# Patient Record
Sex: Female | Born: 1995 | Race: White | Hispanic: No | Marital: Single | Smoking: Current some day smoker
Health system: Southern US, Community
[De-identification: ages and names within clinical notes are randomized; demographics above are authoritative.]

## PROBLEM LIST (undated history)

## (undated) DIAGNOSIS — F603 Borderline personality disorder: Secondary | ICD-10-CM

## (undated) DIAGNOSIS — F419 Anxiety disorder, unspecified: Secondary | ICD-10-CM

## (undated) DIAGNOSIS — D649 Anemia, unspecified: Secondary | ICD-10-CM

---

## 2018-12-24 ENCOUNTER — Emergency Department
Admission: EM | Admit: 2018-12-24 | Discharge: 2018-12-25 | Disposition: A | Discharge status: Home / Self Care | Payer: Self-pay | Attending: Emergency Medicine | Admitting: Emergency Medicine

## 2018-12-24 ENCOUNTER — Encounter: Payer: Self-pay | Admitting: Emergency Medicine

## 2018-12-24 ENCOUNTER — Other Ambulatory Visit: Payer: Self-pay

## 2018-12-24 DIAGNOSIS — F419 Anxiety disorder, unspecified: Secondary | ICD-10-CM | POA: Insufficient documentation

## 2018-12-24 DIAGNOSIS — R0789 Other chest pain: Secondary | ICD-10-CM | POA: Insufficient documentation

## 2018-12-24 HISTORY — DX: Borderline personality disorder: F60.3

## 2018-12-24 HISTORY — DX: Anxiety disorder, unspecified: F41.9

## 2018-12-24 LAB — COMPREHENSIVE METABOLIC PANEL
ALT: 20 U/L (ref 0–44)
AST: 23 U/L (ref 15–41)
Albumin: 3.6 g/dL (ref 3.5–5.0)
Alkaline Phosphatase: 80 U/L (ref 38–126)
Anion gap: 8 (ref 5–15)
BUN: 10 mg/dL (ref 6–20)
CO2: 28 mmol/L (ref 22–32)
Calcium: 8.7 mg/dL — ABNORMAL LOW (ref 8.9–10.3)
Chloride: 104 mmol/L (ref 98–111)
Creatinine, Ser: 0.81 mg/dL (ref 0.44–1.00)
GFR calc Af Amer: >60 mL/min (ref >60)
GFR calc non Af Amer: >60 mL/min (ref >60)
Glucose, Bld: 109 mg/dL — ABNORMAL HIGH (ref 70–99)
Potassium: 3.7 mmol/L (ref 3.5–5.1)
Sodium: 140 mmol/L (ref 135–145)
Total Bilirubin: 0.3 mg/dL (ref 0.3–1.2)
Total Protein: 7 g/dL (ref 6.5–8.1)

## 2018-12-24 LAB — CBC
HCT: 36.3 % (ref 36.0–46.0)
Hemoglobin: 11.2 g/dL — ABNORMAL LOW (ref 12.0–15.0)
MCH: 23.7 pg — ABNORMAL LOW (ref 26.0–34.0)
MCHC: 30.9 g/dL (ref 30.0–36.0)
MCV: 76.7 fL — ABNORMAL LOW (ref 80.0–100.0)
Platelets: 312 10*3/uL (ref 150–400)
RBC: 4.73 MIL/uL (ref 3.87–5.11)
RDW: 17.4 % — ABNORMAL HIGH (ref 11.5–15.5)
WBC: 12.1 10*3/uL — ABNORMAL HIGH (ref 4.0–10.5)
nRBC: 0 % (ref 0.0–0.2)

## 2018-12-24 NOTE — Discharge Instructions (Signed)
Your workup in the Emergency Department today was reassuring.  We did not find any specific abnormalities.  We recommend you drink plenty of fluids, take your regular medications and/or any new ones prescribed today, and follow up with the doctor(s) listed in these documents as recommended.  Return to the Emergency Department if you develop new or worsening symptoms that concern you.  

## 2018-12-24 NOTE — ED Provider Notes (Signed)
[facility] Emergency Department Provider Note  ____________________________________________   First MD Initiated Contact with Patient 12/24/18 2302     (approximate)  I have reviewed the triage vital signs and the nursing notes.   HISTORY  Chief Complaint Anxiety    HPI Elizabeth Simon is a 23 y.o. female with medical history as listed below who presents for evaluation of left-sided chest discomfort.  She says that she was riding in a car with someone when she started to feel anxious with some aching pain in the left side of her chest and a little bit in the left shoulder as well as some burning pain in her upper abdomen.  Her friend told her she which is having a panic attack but she was concerned and wanted to be evaluated.  She feels much better now.  She says she still has a little bit of aching pain in the left upper part of her chest which feels better if she rubs on it and feels a little bit worse if she moves around or moves her left shoulder.  She does not remember sustaining an injury.  The onset was acute and the symptoms were moderate to severe but now they are almost completely gone.  She admits that this does feel similar to prior anxiety attacks but she has not had an anxiety attack in a long time.  She denies fever/chills, contact with COVID patients, sore throat, nausea, vomiting, lower abdominal pain, and dysuria.  She has had no recent medication changes.  She smokes marijuana daily for her anxiety and said that she smoked earlier today.  She denies any other drug use.  She does not use birth control pills, has no history of blood clots in the legs of the lungs, has not been on any long trips or had any surgeries or immobilizations.  She has no first-degree family members who have had heart attacks.  She does not have diabetes, hypertension, nor hyperlipidemia.         Past Medical History:  Diagnosis Date  . Anxiety   . Borderline personality  disorder (HCC)     There are no active problems to display for this patient.   History reviewed. No pertinent surgical history.  Prior to Admission medications   Not on File    Allergies Patient has no allergy information on record.  No family history on file.  Social History Social History   Tobacco Use  . Smoking status: Not on file  Substance Use Topics  . Alcohol use: Not on file  . Drug use: Not on file    Review of Systems Constitutional: No fever/chills Eyes: No visual changes. ENT: No sore throat. Cardiovascular: Left upper chest aching discomfort as well as upper abdominal burning Respiratory: Denies shortness of breath. Gastrointestinal: Burning upper abdominal pain.  No nausea, no vomiting.  No diarrhea.  No constipation. Genitourinary: Negative for dysuria. Musculoskeletal: Left shoulder discomfort, reproducible and feels better with rubbing.  negative for neck pain.  Negative for back pain. Integumentary: Negative for rash. Neurological: Negative for headaches, focal weakness or numbness.   ____________________________________________   PHYSICAL EXAM:  VITAL SIGNS: ED Triage Vitals  Enc Vitals Group     BP 12/24/18 1829 126/64     Pulse Rate 12/24/18 1829 90     Resp 12/24/18 1829 20     Temp 12/24/18 1829 98 F (36.7 C)     Temp Source 12/24/18 1829 Oral  SpO2 12/24/18 1829 100 %     Weight 12/24/18 1830 120.2 kg (265 lb)     Height 12/24/18 1830 1.676 m (5\' 6" )     Head Circumference --      Peak Flow --      Pain Score 12/24/18 1830 6     Pain Loc --      Pain Edu? --      Excl. in GC? --     Constitutional: Alert and oriented. Well appearing and in no acute distress. Eyes: Conjunctivae are normal.  Head: Atraumatic. Nose: No congestion/rhinnorhea. Mouth/Throat: Mucous membranes are moist. Neck: No stridor.  No meningeal signs.   Cardiovascular: Normal rate, regular rhythm. Good peripheral circulation. Grossly normal heart  sounds. Respiratory: Normal respiratory effort.  No retractions. No audible wheezing. Gastrointestinal: Soft and nontender. No distention.  Musculoskeletal: No lower extremity tenderness nor edema. No gross deformities of extremities.  Some reproducible tenderness with moving around of the left shoulder, she says it feels better when rubbing the left chest wall.  Neurologic:  Normal speech and language. No gross focal neurologic deficits are appreciated.  Skin:  Skin is warm, dry and intact. No rash noted. Psychiatric: Mood and affect are normal. Speech and behavior are normal.  ____________________________________________   LABS (all labs ordered are listed, but only abnormal results are displayed)  Labs Reviewed  CBC - Abnormal; Notable for the following components:      Result Value   WBC 12.1 (*)    Hemoglobin 11.2 (*)    MCV 76.7 (*)    MCH 23.7 (*)    RDW 17.4 (*)    All other components within normal limits  COMPREHENSIVE METABOLIC PANEL - Abnormal; Notable for the following components:   Glucose, Bld 109 (*)    Calcium 8.7 (*)    All other components within normal limits   ____________________________________________  EKG  ED ECG REPORT I, Loleta Roseory Eris Breck, the attending physician, personally viewed and interpreted this ECG.  Date: 12/24/2018 EKG Time: 18: 31 Rate: 89 Rhythm: normal sinus rhythm QRS Axis: normal Intervals: normal ST/T Wave abnormalities: Inverted T waves in lead III. Narrative Interpretation: no definitive evidence of acute ischemia; does not meet STEMI criteria.   ____________________________________________  RADIOLOGY I, Loleta Roseory Miamarie Moll, personally viewed and evaluated these images (plain radiographs) as part of my medical decision making, as well as reviewing the written report by the radiologist.  ED MD interpretation: No indication for imaging  Official radiology report(s): No results found.  ____________________________________________    PROCEDURES   Procedure(s) performed (including Critical Care):  Procedures   ____________________________________________   INITIAL IMPRESSION / MDM / ASSESSMENT AND PLAN / ED COURSE  As part of my medical decision making, I reviewed the following data within the electronic MEDICAL RECORD NUMBER Nursing notes reviewed and incorporated, Labs reviewed , EKG interpreted , Old chart reviewed and Notes from prior ED visits      *Almedia Ballsikki C Maneri was evaluated in Emergency Department on 12/25/2018 for the symptoms described in the history of present illness. She was evaluated in the context of the global COVID-19 pandemic, which necessitated consideration that the patient might be at risk for infection with the SARS-CoV-2 virus that causes COVID-19. Institutional protocols and algorithms that pertain to the evaluation of patients at risk for COVID-19 are in a state of rapid change based on information released by regulatory bodies including the CDC and federal and state organizations. These policies and algorithms were followed during the  patient's care in the ED.*  Differential diagnosis includes, but is not limited to, anxiety/panic attack, musculoskeletal strain, ACS, PE, less likely pneumonia or COVID-19.  The patient has no signs or symptoms or risk factors for COVID-19.  She also is very low risk for ACS and she is PERC negative.  Originally troponin was not drawn with her labs but her EKG is very reassuring and her signs and symptoms are not at all consistent with ACS in addition to being 23 years old and the only risk factor being tobacco use.  She is eager and anxious to go home I think it is appropriate as a troponin will have very little utility in this case and will only delay care given the incredibly low utility of a troponin and this patient population.  She was reassured by the fact that her hemoglobin was at 11.2 because she said that she has suffered from chronic anemia in the past.  She is  well, asymptomatic except for some reproducible left upper chest musculoskeletal discomfort, and appropriate for discharge and outpatient follow-up.  I gave her strict return precautions should she develop any new or worsening symptoms and she understands and agrees with the plan.      ____________________________________________  FINAL CLINICAL IMPRESSION(S) / ED DIAGNOSES  Final diagnoses:  Atypical chest pain  Anxiety     MEDICATIONS GIVEN DURING THIS VISIT:  Medications - No data to display   ED Discharge Orders    None       Note:  This document was prepared using Dragon voice recognition software and may include unintentional dictation errors.   Loleta Rose, MD 12/25/18 [phone]

## 2018-12-24 NOTE — ED Triage Notes (Signed)
Pt reports has an odd feeling in her chest. Pt states it is sort of like anxiety but she has not had anxiety in 10 months. Pt states she just feels like it is hard to breathe and she can't sit still.

## 2019-01-08 ENCOUNTER — Encounter: Payer: Self-pay | Admitting: Emergency Medicine

## 2019-01-08 ENCOUNTER — Other Ambulatory Visit: Payer: Self-pay

## 2019-01-08 ENCOUNTER — Emergency Department: Payer: Medicaid Other

## 2019-01-08 ENCOUNTER — Emergency Department
Admission: EM | Admit: 2019-01-08 | Discharge: 2019-01-08 | Disposition: A | Discharge status: Home / Self Care | Payer: Medicaid Other | Attending: Emergency Medicine | Admitting: Emergency Medicine

## 2019-01-08 DIAGNOSIS — K219 Gastro-esophageal reflux disease without esophagitis: Secondary | ICD-10-CM | POA: Insufficient documentation

## 2019-01-08 DIAGNOSIS — J0110 Acute frontal sinusitis, unspecified: Secondary | ICD-10-CM | POA: Insufficient documentation

## 2019-01-08 LAB — TROPONIN I: Troponin I: <0.03 ng/mL (ref <0.03)

## 2019-01-08 LAB — BASIC METABOLIC PANEL
Anion gap: 10 (ref 5–15)
BUN: 11 mg/dL (ref 6–20)
CO2: 26 mmol/L (ref 22–32)
Calcium: 8.9 mg/dL (ref 8.9–10.3)
Chloride: 100 mmol/L (ref 98–111)
Creatinine, Ser: 0.67 mg/dL (ref 0.44–1.00)
GFR calc Af Amer: >60 mL/min (ref >60)
GFR calc non Af Amer: >60 mL/min (ref >60)
Glucose, Bld: 107 mg/dL — ABNORMAL HIGH (ref 70–99)
Potassium: 3.7 mmol/L (ref 3.5–5.1)
Sodium: 136 mmol/L (ref 135–145)

## 2019-01-08 LAB — CBC
HCT: 36.9 % (ref 36.0–46.0)
Hemoglobin: 11.5 g/dL — ABNORMAL LOW (ref 12.0–15.0)
MCH: 23.3 pg — ABNORMAL LOW (ref 26.0–34.0)
MCHC: 31.2 g/dL (ref 30.0–36.0)
MCV: 74.8 fL — ABNORMAL LOW (ref 80.0–100.0)
Platelets: 353 10*3/uL (ref 150–400)
RBC: 4.93 MIL/uL (ref 3.87–5.11)
RDW: 17.2 % — ABNORMAL HIGH (ref 11.5–15.5)
WBC: 11.2 10*3/uL — ABNORMAL HIGH (ref 4.0–10.5)
nRBC: 0 % (ref 0.0–0.2)

## 2019-01-08 MED ORDER — FLUTICASONE PROPIONATE 50 MCG/ACT NA SUSP: 2.0000 | Freq: Every day | NASAL | 0 refills | Status: DC

## 2019-01-08 MED ORDER — AMOXICILLIN 500 MG PO TABS: 500.0000 mg | ORAL_TABLET | Freq: Three times a day (TID) | ORAL | 0 refills | Status: DC

## 2019-01-08 MED ORDER — FAMOTIDINE 20 MG PO TABS: 20.0000 mg | ORAL_TABLET | Freq: Two times a day (BID) | ORAL | 0 refills | Status: DC

## 2019-01-08 NOTE — Discharge Instructions (Signed)
Follow up with the primary care provider for symptoms that are not improving over the next few days. ° °Return to the ER for symptoms that change or worsen if unable to schedule an appointment. °

## 2019-01-08 NOTE — ED Triage Notes (Signed)
Pt reports HA for 2 days that hurts mostly in her face. Pt also reports some chest discomfort that she describes as acid reflux. Pt denies SOB, fevers, nausea or other sx;s.

## 2019-01-08 NOTE — ED Notes (Signed)
See triage note  Presents with headache for the past 2 days  States pain is mainly to right side of head and under right eye  States she fell on Thursday but did not hit her head

## 2019-01-09 NOTE — ED Provider Notes (Signed)
[facility] Emergency Department Provider Note  ___________________________________________   First MD Initiated Contact with Patient 01/08/19 1236     (approximate)  I have reviewed the triage vital signs and the nursing notes.   HISTORY  Chief Complaint Headache  HPI Elizabeth Simon is a 23 y.o. female who presents to the emergency department for multiple medical complaints.  She states that she has had a headache for 2 days and sinus pain and pressure on the right side since that time as well.  She also states that she has some significant sleep anxiety, but is afraid to take any melatonin or sleep aids.  She reports her anxiety causes her to have acid reflux and she knows that this is what causes her chest discomfort but it makes the anxiety worse.  Pain is better after antacids.  Headache has not been relieved with Tylenol and ibuprofen.     Past Medical History:  Diagnosis Date  . Anxiety   . Borderline personality disorder (HCC)     There are no active problems to display for this patient.   History reviewed. No pertinent surgical history.  Prior to Admission medications   Medication Sig Start Date End Date Taking? Authorizing Provider  amoxicillin (AMOXIL) 500 MG tablet Take 1 tablet (500 mg total) by mouth 3 (three) times daily. 01/08/19   Dillen Belmontes, Rulon Eisenmengerari B, FNP  famotidine (PEPCID) 20 MG tablet Take 1 tablet (20 mg total) by mouth 2 (two) times daily for 30 days. 01/08/19 02/07/19  Jaeleen Inzunza, Kasandra Knudsenari B, FNP  fluticasone (FLONASE) 50 MCG/ACT nasal spray Place 2 sprays into both nostrils daily for 30 days. 01/08/19 02/07/19  Chinita Pesterriplett, Seth Friedlander B, FNP    Allergies Patient has no allergy information on record.  No family history on file.  Social History Social History   Tobacco Use  . Smoking status: Not on file  Substance Use Topics  . Alcohol use: Not on file  . Drug use: Not on file    Review of Systems  Constitutional: No fever/chills  Eyes: No visual changes. ENT: No sore throat. Cardiovascular: Denies chest pain currently. Respiratory: Denies shortness of breath. Gastrointestinal: No abdominal pain.  No nausea, no vomiting.  No diarrhea.  No constipation. Genitourinary: Negative for dysuria. Musculoskeletal: Negative for back pain. Skin: Negative for rash. Neurological: Negative for headaches, focal weakness or numbness. ____________________________________________   PHYSICAL EXAM:  VITAL SIGNS: ED Triage Vitals [01/08/19 1122]  Enc Vitals Group     BP 110/76     Pulse Rate 96     Resp 20     Temp 98.3 F (36.8 C)     Temp Source Oral     SpO2 99 %     Weight 265 lb (120.2 kg)     Height 5\' 6"  (1.676 m)     Head Circumference      Peak Flow      Pain Score 5     Pain Loc      Pain Edu?      Excl. in GC?     Constitutional: Alert and oriented. Well appearing and in no acute distress. Eyes: Conjunctivae are normal. PERRL. EOMI. Head: Atraumatic. Nose: No congestion/rhinnorhea.  Tenderness over the right maxillary sinus on percussion Mouth/Throat: Mucous membranes are moist.  Oropharynx non-erythematous. Neck: No stridor.   Cardiovascular: Normal rate, regular rhythm. Grossly normal heart sounds.  Good peripheral circulation. Respiratory: Normal respiratory effort.  No retractions. Lungs CTAB. Gastrointestinal: Soft and nontender.  No distention. No abdominal bruits. No CVA tenderness. Musculoskeletal: No lower extremity tenderness nor edema.  No joint effusions. Neurologic:  Normal speech and language. No gross focal neurologic deficits are appreciated. No gait instability. Skin:  Skin is warm, dry and intact. No rash noted. Psychiatric: Mood and affect are normal. Speech and behavior are normal.  ____________________________________________   LABS (all labs ordered are listed, but only abnormal results are displayed)  Labs Reviewed  BASIC METABOLIC PANEL - Abnormal; Notable for the following  components:      Result Value   Glucose, Bld 107 (*)    All other components within normal limits  CBC - Abnormal; Notable for the following components:   WBC 11.2 (*)    Hemoglobin 11.5 (*)    MCV 74.8 (*)    MCH 23.3 (*)    RDW 17.2 (*)    All other components within normal limits  TROPONIN I   ____________________________________________  EKG  ED ECG REPORT I, Davaughn Hillyard, FNP-BC personally viewed and interpreted this ECG.   Date: 01/09/2019  EKG Time: 11:28 AM  Rate: 99  Rhythm: normal EKG, normal sinus rhythm  Axis: normal  Intervals:none  ST&T Change: none  ____________________________________________  RADIOLOGY  ED MD interpretation:  No acute abnormality.  Official radiology report(s): Dg Chest 2 View  Result Date: 01/08/2019 CLINICAL DATA:  Headache, chest discomfort EXAM: CHEST - 2 VIEW COMPARISON:  None. FINDINGS: The heart size and mediastinal contours are within normal limits. Both lungs are clear. The visualized skeletal structures are unremarkable. IMPRESSION: No acute abnormality of the lungs.  No focal airspace opacity. Electronically Signed   By: Lauralyn Primes M.D.   On: 01/08/2019 12:43    ____________________________________________   PROCEDURES  Procedure(s) performed (including Critical Care):  Procedures   ____________________________________________   INITIAL IMPRESSION / ASSESSMENT AND PLAN / ED COURSE  As part of my medical decision making, I reviewed the following data within the electronic MEDICAL RECORD NUMBER Notes from prior ED visits        23 year old female presenting to the emergency department for multiple medical complaints.  Exam is consistent with a sinusitis which would also explain headache.  She will be treated with antibiotics.  We discussed sleep aids including melatonin and Tylenol PM or Advil PM and she states that she is willing to try them.  For her reflux, she will be given a prescription for Pepcid.  She was  encouraged to establish primary care provider when possible.  She is to return to the emergency department for symptoms change or worsen if she is unable to get an appointment.      ____________________________________________   FINAL CLINICAL IMPRESSION(S) / ED DIAGNOSES  Final diagnoses:  Acute non-recurrent frontal sinusitis  Gastroesophageal reflux disease without esophagitis     ED Discharge Orders         Ordered    famotidine (PEPCID) 20 MG tablet  2 times daily     01/08/19 1255    amoxicillin (AMOXIL) 500 MG tablet  3 times daily     01/08/19 1255    fluticasone (FLONASE) 50 MCG/ACT nasal spray  Daily     01/08/19 1255           Note:  This document was prepared using Dragon voice recognition software and may include unintentional dictation errors.    Chinita Pester, FNP 01/09/19 0900    Sharman Cheek, MD 01/09/19 [phone]

## 2019-01-14 ENCOUNTER — Encounter: Payer: Self-pay | Admitting: Emergency Medicine

## 2019-01-14 ENCOUNTER — Other Ambulatory Visit: Payer: Self-pay

## 2019-01-14 DIAGNOSIS — R51 Headache: Secondary | ICD-10-CM | POA: Insufficient documentation

## 2019-01-14 NOTE — ED Triage Notes (Signed)
Patient ambulatory to triage with steady gait, without difficulty or distress noted; pt reports right sided HA x with no accomp symptoms; currently taking amoxi for sinus infection but denies any symptoms of illness

## 2019-01-15 ENCOUNTER — Emergency Department
Admission: EM | Admit: 2019-01-15 | Discharge: 2019-01-15 | Disposition: A | Discharge status: Home / Self Care | Payer: Medicaid Other | Attending: Emergency Medicine | Admitting: Emergency Medicine

## 2019-01-15 DIAGNOSIS — R519 Headache, unspecified: Secondary | ICD-10-CM

## 2019-01-15 MED ORDER — OXYMETAZOLINE HCL 0.05 % NA SOLN
1.0000 | Freq: Once | NASAL | Status: AC
  Administered 2019-01-15: 01:00:00 1 via NASAL
  Filled 2019-01-15: qty 30

## 2019-01-15 MED ORDER — IBUPROFEN 600 MG PO TABS
600.0000 mg | ORAL_TABLET | Freq: Once | ORAL | Status: AC
  Administered 2019-01-15: 600 mg via ORAL
  Filled 2019-01-15: qty 1

## 2019-01-15 NOTE — ED Provider Notes (Signed)
[facility] Emergency Department Provider Note  ____________________________________________  Time seen: Approximately 12:52 AM  I have reviewed the triage vital signs and the nursing notes.   HISTORY  Chief Complaint Headache   HPI Elizabeth Simon is a 23 y.o. female history of anxiety and borderline personality disorder who presents for evaluation of a headache.  Patient is currently on amoxicillin for sinusitis.  She continues to have mild congestion.  30 minutes prior to arrival she reports having a sharp severe pain on the right side of her face with lasted a few minutes and it got progressively worse.  At this time the pain is very mild.  No fever, no neck stiffness, no rash, no sore throat, no chest pain or shortness of breath.  No personal or family history of aneurysms.  She reports that the pain is around her right eye and the right maxillary and frontal sinus.  She is not using any decongestants.  Past Medical History:  Diagnosis Date  . Anxiety   . Borderline personality disorder (HCC)     There are no active problems to display for this patient.   History reviewed. No pertinent surgical history.  Prior to Admission medications   Medication Sig Start Date End Date Taking? Authorizing Provider  amoxicillin (AMOXIL) 500 MG tablet Take 1 tablet (500 mg total) by mouth 3 (three) times daily. 01/08/19   Triplett, Rulon Eisenmenger B, FNP  famotidine (PEPCID) 20 MG tablet Take 1 tablet (20 mg total) by mouth 2 (two) times daily for 30 days. 01/08/19 02/07/19  Triplett, Kasandra Knudsen, FNP  fluticasone (FLONASE) 50 MCG/ACT nasal spray Place 2 sprays into both nostrils daily for 30 days. 01/08/19 02/07/19  Chinita Pester, FNP    Allergies Patient has no known allergies.  No family history on file.  Social History Social History   Tobacco Use  . Smoking status: Former Games developer  . Smokeless tobacco: Never Used  Substance Use Topics  . Alcohol use: Not on file  .  Drug use: Not on file    Review of Systems  Constitutional: Negative for fever. Eyes: Negative for visual changes. ENT: Negative for sore throat. Neck: No neck pain  Cardiovascular: Negative for chest pain. Respiratory: Negative for shortness of breath. Gastrointestinal: Negative for abdominal pain, vomiting or diarrhea. Genitourinary: Negative for dysuria. Musculoskeletal: Negative for back pain. Skin: Negative for rash. Neurological: Negative for weakness or numbness. + HA Psych: No SI or HI  ____________________________________________   PHYSICAL EXAM:  VITAL SIGNS: ED Triage Vitals [01/14/19 2152]  Enc Vitals Group     BP (!) 110/52     Pulse Rate 86     Resp 18     Temp 98.9 F (37.2 C)     Temp Source Oral     SpO2 100 %     Weight 265 lb (120.2 kg)     Height 5\' 6"  (1.676 m)     Head Circumference      Peak Flow      Pain Score 5     Pain Loc      Pain Edu?      Excl. in GC?     Constitutional: Alert and oriented. Well appearing and in no apparent distress. HEENT:      Head: Normocephalic and atraumatic.   Mild tenderness palpation of the right frontal and maxillary sinuses      Eyes: Conjunctivae are normal. Sclera is non-icteric.  Mouth/Throat: Mucous membranes are moist.       Neck: Supple with no signs of meningismus. Cardiovascular: Regular rate and rhythm. No murmurs, gallops, or rubs. 2+ symmetrical distal pulses are present in all extremities. No JVD. Respiratory: Normal respiratory effort. Lungs are clear to auscultation bilaterally. No wheezes, crackles, or rhonchi.  Musculoskeletal:No edema, cyanosis, or erythema of extremities. Neurologic: Normal speech and language. Face is symmetric. Moving all extremities.  Normal strength and sensation x4 Skin: Skin is warm, dry and intact. No rash noted. Psychiatric: Mood and affect are normal. Speech and behavior are normal.  ____________________________________________   LABS (all labs ordered  are listed, but only abnormal results are displayed)  Labs Reviewed - No data to display ____________________________________________  EKG  none  ____________________________________________  RADIOLOGY  none  ____________________________________________   PROCEDURES  Procedure(s) performed: None Procedures Critical Care performed:  None ____________________________________________   INITIAL IMPRESSION / ASSESSMENT AND PLAN / ED COURSE  23 y.o. female history of anxiety and borderline personality disorder who presents for evaluation of a headache.  Presentation concerning for sinus headache.  Low suspicion for more serious or life threatening etiology of HA based on history and exam. No sudden onset thunderclap HA, onset with exertion, vomiting, focal neurologic deficits, to suggest increased risk of subarachnoid hemorrhage. No fever, neck pain, neck stiffness, or meningismus on exam to suggest meningitis. No fevers, altered mental status, unusual behavior to suggest encephalitis. No focal neurologic deficits by history or exam to suggest central venous thrombosis. No constitutional symptoms including fever, fatigue, weight loss, temporal scalp tenderness, jaw claudication, visual loss, to suggest temporal arteritis. No immunocompromise to suggest increased risk for intracranial infectious disease. No visual changes or findings on ocular exam to suggest acute angle closure glaucoma. No reports of toxic exposures including carbon monoxide or other household members with similar symptoms.  Will treat with afrin and ibuprofen.  Discussed my standard return precautions and follow-up with PCP       As part of my medical decision making, I reviewed the following data within the electronic MEDICAL RECORD NUMBER Nursing notes reviewed and incorporated, Old chart reviewed, Notes from prior ED visits and  Controlled Substance Database    Pertinent labs & imaging results that were available  during my care of the patient were reviewed by me and considered in my medical decision making (see chart for details).    ____________________________________________   FINAL CLINICAL IMPRESSION(S) / ED DIAGNOSES  Final diagnoses:  Sinus headache      NEW MEDICATIONS STARTED DURING THIS VISIT:  ED Discharge Orders    None       Note:  This document was prepared using Dragon voice recognition software and may include unintentional dictation errors.    Don Perking, [place], MD 01/15/19 [phone]

## 2019-01-15 NOTE — Discharge Instructions (Addendum)
You have been seen in the Emergency Department (ED) for a headache. Your evaluation today was overall reassuring. Headaches have many possible causes. Most headaches aren't a sign of a more serious problem, and they will get better on their own.   Follow-up with your doctor in 12-24 hours if you are still having a headache. Otherwise follow up with your doctor in 3-5 days.  For pain take ibuprofen 600mg  every 6 hours as needed. Use afrin 3 times a day to help with decongestant  When should you call for help?  Call 911 or return to the ED anytime you think you may need emergency care. For example, call if:  You have signs of a stroke. These may include:  Sudden numbness, paralysis, or weakness in your face, arm, or leg, especially on only one side of your body.  Sudden vision changes.  Sudden trouble speaking.  Sudden confusion or trouble understanding simple statements.  Sudden problems with walking or balance.  A sudden, severe headache that is different from past headaches. You have new or worsening headache Nausea and vomiting associated with your headache Fever, neck stiffness associated with your headache  Call your doctor now or seek immediate medical care if:  You have a new or worse headache.  Your headache gets much worse.  How can you care for yourself at home?  Do not drive if you have taken a prescription pain medicine.  Rest in a quiet, dark room until your headache is gone. Close your eyes and try to relax or go to sleep. Don't watch TV or read.  Put a cold, moist cloth or cold pack on the painful area for 10 to 20 minutes at a time. Put a thin cloth between the cold pack and your skin.  Use a warm, moist towel or a heating pad set on low to relax tight shoulder and neck muscles.  Have someone gently massage your neck and shoulders.  Take pain medicines exactly as directed.  If the doctor gave you a prescription medicine for pain, take it as prescribed.  If you are not  taking a prescription pain medicine, ask your doctor if you can take an over-the-counter medicine. Be careful not to take pain medicine more often than the instructions allow, because you may get worse or more frequent headaches when the medicine wears off.  Do not ignore new symptoms that occur with a headache, such as a fever, weakness or numbness, vision changes, or confusion. These may be signs of a more serious problem.  To prevent headaches  Keep a headache diary so you can figure out what triggers your headaches. Avoiding triggers may help you prevent headaches. Record when each headache began, how long it lasted, and what the pain was like (throbbing, aching, stabbing, or dull). Write down any other symptoms you had with the headache, such as nausea, flashing lights or dark spots, or sensitivity to bright light or loud noise. Note if the headache occurred near your period. List anything that might have triggered the headache, such as certain foods (chocolate, cheese, wine) or odors, smoke, bright light, stress, or lack of sleep.  Find healthy ways to deal with stress. Headaches are most common during or right after stressful times. Take time to relax before and after you do something that has caused a headache in the past.  Try to keep your muscles relaxed by keeping good posture. Check your jaw, face, neck, and shoulder muscles for tension, and try relaxing them. When  sitting at a desk, change positions often, and stretch for 30 seconds each hour.  Get plenty of sleep and exercise.  Eat regularly and well. Long periods without food can trigger a headache.  Treat yourself to a massage. Some people find that regular massages are very helpful in relieving tension.  Limit caffeine by not drinking too much coffee, tea, or soda. But don't quit caffeine suddenly, because that can also give you headaches.  Reduce eyestrain from computers by blinking frequently and looking away from the computer screen  every so often. Make sure you have proper eyewear and that your monitor is set up properly, about an arm's length away.  Seek help if you have depression or anxiety. Your headaches may be linked to these conditions. Treatment can both prevent headaches and help with symptoms of anxiety or depression.

## 2019-01-16 ENCOUNTER — Telehealth: Payer: Medicaid Other | Admitting: Nurse Practitioner

## 2019-01-16 DIAGNOSIS — J0100 Acute maxillary sinusitis, unspecified: Secondary | ICD-10-CM

## 2019-01-16 MED ORDER — AMOXICILLIN-POT CLAVULANATE 875-125 MG PO TABS: 1.0000 | ORAL_TABLET | Freq: Two times a day (BID) | ORAL | 0 refills | Status: DC

## 2019-01-16 NOTE — Progress Notes (Signed)

## 2019-01-30 ENCOUNTER — Other Ambulatory Visit: Payer: Self-pay

## 2019-01-30 ENCOUNTER — Encounter: Payer: Self-pay | Admitting: Emergency Medicine

## 2019-01-30 ENCOUNTER — Emergency Department
Admission: EM | Admit: 2019-01-30 | Discharge: 2019-01-30 | Disposition: A | Discharge status: Home / Self Care | Payer: Self-pay | Attending: Emergency Medicine | Admitting: Emergency Medicine

## 2019-01-30 ENCOUNTER — Emergency Department: Payer: Self-pay

## 2019-01-30 DIAGNOSIS — R079 Chest pain, unspecified: Secondary | ICD-10-CM

## 2019-01-30 DIAGNOSIS — F419 Anxiety disorder, unspecified: Secondary | ICD-10-CM | POA: Insufficient documentation

## 2019-01-30 DIAGNOSIS — F17200 Nicotine dependence, unspecified, uncomplicated: Secondary | ICD-10-CM | POA: Insufficient documentation

## 2019-01-30 DIAGNOSIS — R0789 Other chest pain: Secondary | ICD-10-CM | POA: Insufficient documentation

## 2019-01-30 DIAGNOSIS — Z79899 Other long term (current) drug therapy: Secondary | ICD-10-CM | POA: Insufficient documentation

## 2019-01-30 HISTORY — DX: Anemia, unspecified: D64.9

## 2019-01-30 LAB — CBC
HCT: 33.3 % — ABNORMAL LOW (ref 36.0–46.0)
Hemoglobin: 10.3 g/dL — ABNORMAL LOW (ref 12.0–15.0)
MCH: 23.6 pg — ABNORMAL LOW (ref 26.0–34.0)
MCHC: 30.9 g/dL (ref 30.0–36.0)
MCV: 76.2 fL — ABNORMAL LOW (ref 80.0–100.0)
Platelets: 277 10*3/uL (ref 150–400)
RBC: 4.37 MIL/uL (ref 3.87–5.11)
RDW: 17.3 % — ABNORMAL HIGH (ref 11.5–15.5)
WBC: 11.7 10*3/uL — ABNORMAL HIGH (ref 4.0–10.5)
nRBC: 0 % (ref 0.0–0.2)

## 2019-01-30 LAB — BASIC METABOLIC PANEL
Anion gap: 9 (ref 5–15)
BUN: 10 mg/dL (ref 6–20)
CO2: 23 mmol/L (ref 22–32)
Calcium: 8.3 mg/dL — ABNORMAL LOW (ref 8.9–10.3)
Chloride: 106 mmol/L (ref 98–111)
Creatinine, Ser: 0.53 mg/dL (ref 0.44–1.00)
GFR calc Af Amer: >60 mL/min (ref >60)
GFR calc non Af Amer: >60 mL/min (ref >60)
Glucose, Bld: 131 mg/dL — ABNORMAL HIGH (ref 70–99)
Potassium: 4 mmol/L (ref 3.5–5.1)
Sodium: 138 mmol/L (ref 135–145)

## 2019-01-30 LAB — LIPASE, BLOOD: Lipase: 34 U/L (ref 11–51)

## 2019-01-30 LAB — TROPONIN I: Troponin I: <0.03 ng/mL (ref <0.03)

## 2019-01-30 LAB — HEPATIC FUNCTION PANEL
ALT: 17 U/L (ref 0–44)
AST: 26 U/L (ref 15–41)
Albumin: 3.4 g/dL — ABNORMAL LOW (ref 3.5–5.0)
Alkaline Phosphatase: 63 U/L (ref 38–126)
Bilirubin, Direct: 0.3 mg/dL — ABNORMAL HIGH (ref 0.0–0.2)
Indirect Bilirubin: 0.3 mg/dL (ref 0.3–0.9)
Total Bilirubin: 0.6 mg/dL (ref 0.3–1.2)
Total Protein: 6.7 g/dL (ref 6.5–8.1)

## 2019-01-30 LAB — FIBRIN DERIVATIVES D-DIMER (ARMC ONLY): Fibrin derivatives D-dimer (ARMC): 387.61 ng/mL (FEU) (ref 0.00–499.00)

## 2019-01-30 LAB — POCT PREGNANCY, URINE: Preg Test, Ur: NEGATIVE

## 2019-01-30 MED ORDER — LORAZEPAM 1 MG PO TABS: 1.0000 mg | ORAL_TABLET | Freq: Three times a day (TID) | ORAL | 0 refills | Status: DC | PRN

## 2019-01-30 MED ORDER — KETOROLAC TROMETHAMINE 30 MG/ML IJ SOLN
15.0000 mg | Freq: Once | INTRAMUSCULAR | Status: DC
  Filled 2019-01-30: qty 1

## 2019-01-30 NOTE — Discharge Instructions (Addendum)
You may take Ativan as needed for anxiety.  Return to the ER for worsening symptoms, persistent vomiting, difficulty breathing or other concerns. 

## 2019-01-30 NOTE — ED Triage Notes (Signed)
Pt presents to ED via EMS with c/o chest pressure in the center of her chest. Pain increases when trying to take in a deep breath and makes her feel sob. Onset of symptoms was several weeks ago.  pt was evaluated and tx in this ED from acid reflux. Pt states her medications she was prescribed are not helping. Pt does not have a pcp.

## 2019-01-30 NOTE — ED Provider Notes (Signed)
[facility] Emergency Department Provider Note   ____________________________________________   First MD Initiated Contact with Patient 01/30/19 [phone]     (approximate)  I have reviewed the triage vital signs and the nursing notes.   HISTORY  Chief Complaint Chest Pain and Shortness of Breath    HPI Elizabeth Simon is a 23 y.o. female who presents to the ED from home with a chief complaint of epigastric/chest pain.  Patient has had a several week history of the same with intermittent chest pain/burning.  Diagnosed with GERD and anxiety but states no relief with over-the-counter omeprazole.  Pain increases when she tries to take a deep breath and makes her feel short of breath.  Multiple life stressors currently. Denies SI/HI/AH/VH. Denies fever, cough, nausea, vomiting, diarrhea.  Denies recent trauma or exposure to persons diagnosed with coronavirus.  Denies OCP use.  Recent trip to the beach.       Past Medical History:  Diagnosis Date  . Anemia   . Anxiety   . Borderline personality disorder ([place])     There are no active problems to display for this patient.   History reviewed. No pertinent surgical history.  Prior to Admission medications   Medication Sig Start Date End Date Taking? Authorizing Provider  amoxicillin (AMOXIL) 500 MG tablet Take 1 tablet (500 mg total) by mouth 3 (three) times daily. 01/08/19   Triplett, Johnette Abraham B, FNP  amoxicillin-clavulanate (AUGMENTIN) 875-125 MG tablet Take 1 tablet by mouth 2 (two) times daily. 01/16/19   Hassell Done, Mary-Margaret, FNP  famotidine (PEPCID) 20 MG tablet Take 1 tablet (20 mg total) by mouth 2 (two) times daily for 30 days. 01/08/19 02/07/19  Triplett, Dessa Phi, FNP  fluticasone (FLONASE) 50 MCG/ACT nasal spray Place 2 sprays into both nostrils daily for 30 days. 01/08/19 02/07/19  Triplett, Dessa Phi, FNP  LORazepam (ATIVAN) 1 MG tablet Take 1 tablet (1 mg total) by mouth every 8 (eight) hours as needed for  anxiety. 01/30/19   Paulette Blanch, MD    Allergies Patient has no known allergies.  No family history on file.  Social History Social History   Tobacco Use  . Smoking status: Current Some Day Smoker  . Smokeless tobacco: Never Used  Substance Use Topics  . Alcohol use: Yes    Frequency: Never  . Drug use: Not Currently    Review of Systems  Constitutional: No fever/chills Eyes: No visual changes. ENT: No sore throat. Cardiovascular: Positive for chest pain. Respiratory: Denies shortness of breath. Gastrointestinal: No abdominal pain.  No nausea, no vomiting.  No diarrhea.  No constipation. Genitourinary: Negative for dysuria. Musculoskeletal: Negative for back pain. Skin: Negative for rash. Neurological: Negative for headaches, focal weakness or numbness.   ____________________________________________   PHYSICAL EXAM:  VITAL SIGNS: ED Triage Vitals  Enc Vitals Group     BP 01/30/19 0135 123/61     Pulse Rate 01/30/19 0135 88     Resp 01/30/19 0135 18     Temp 01/30/19 0135 98.4 F (36.9 C)     Temp Source 01/30/19 0135 Oral     SpO2 01/30/19 0135 99 %     Weight 01/30/19 0130 260 lb (117.9 kg)     Height 01/30/19 0130 5\' 6"  (1.676 m)     Head Circumference --      Peak Flow --      Pain Score 01/30/19 0130 5     Pain Loc --  Pain Edu? --      Excl. in GC? --     Constitutional: Alert and oriented. Well appearing and in no acute distress. Eyes: Conjunctivae are normal. PERRL. EOMI. Head: Atraumatic. Nose: No congestion/rhinnorhea. Mouth/Throat: Mucous membranes are moist.  Oropharynx non-erythematous. Neck: No stridor.   Cardiovascular: Normal rate, regular rhythm. Grossly normal heart sounds.  Good peripheral circulation. Respiratory: Normal respiratory effort.  No retractions. Lungs CTAB.  Anterior chest tender to palpation. Gastrointestinal: Soft and nontender. No distention. No abdominal bruits. No CVA tenderness. Musculoskeletal: No lower  extremity tenderness nor edema.  No joint effusions. Neurologic:  Normal speech and language. No gross focal neurologic deficits are appreciated. No gait instability. Skin:  Skin is warm, dry and intact. No rash noted. Psychiatric: Mood and affect are normal. Speech and behavior are normal.  ____________________________________________   LABS (all labs ordered are listed, but only abnormal results are displayed)  Labs Reviewed  BASIC METABOLIC PANEL - Abnormal; Notable for the following components:      Result Value   Glucose, Bld 131 (*)    Calcium 8.3 (*)    All other components within normal limits  CBC - Abnormal; Notable for the following components:   WBC 11.7 (*)    Hemoglobin 10.3 (*)    HCT 33.3 (*)    MCV 76.2 (*)    MCH 23.6 (*)    RDW 17.3 (*)    All other components within normal limits  HEPATIC FUNCTION PANEL - Abnormal; Notable for the following components:   Albumin 3.4 (*)    Bilirubin, Direct 0.3 (*)    All other components within normal limits  TROPONIN I  LIPASE, BLOOD  FIBRIN DERIVATIVES D-DIMER ([facility] ONLY)  POC URINE PREG, ED  POCT PREGNANCY, URINE   ____________________________________________  EKG  ED ECG REPORT I, Schae Cando J, the attending physician, personally viewed and interpreted this ECG.   Date: 01/30/2019  EKG Time: 0131  Rate: 91  Rhythm: normal EKG, normal sinus rhythm  Axis: Normal  Intervals:none  ST&T Change: Nonspecific  ____________________________________________  RADIOLOGY  ED MD interpretation:  No acute cardiopulmonary process  Official radiology report(s): Dg Chest Port 1 View  Result Date: 01/30/2019 CLINICAL DATA:  Central chest pain. EXAM: PORTABLE CHEST 1 VIEW COMPARISON:  01/08/2019 FINDINGS: The cardiomediastinal contours are normal. The lungs are clear. Pulmonary vasculature is normal. No consolidation, pleural effusion, or pneumothorax. No acute osseous abnormalities are seen. IMPRESSION: No acute chest  findings. Electronically Signed   By: Narda RutherfordMelanie  Sanford M.D.   On: 01/30/2019 04:05    ____________________________________________   PROCEDURES  Procedure(s) performed (including Critical Care):  Procedures   ____________________________________________   INITIAL IMPRESSION / ASSESSMENT AND PLAN / ED COURSE  As part of my medical decision making, I reviewed the following data within the electronic MEDICAL RECORD NUMBER Nursing notes reviewed and incorporated, Labs reviewed, EKG interpreted, Old chart reviewed, Radiograph reviewed and Notes from prior ED visits     Elizabeth Simon was evaluated in Emergency Department on 01/30/2019 for the symptoms described in the history of present illness. She was evaluated in the context of the global COVID-19 pandemic, which necessitated consideration that the patient might be at risk for infection with the SARS-CoV-2 virus that causes COVID-19. Institutional protocols and algorithms that pertain to the evaluation of patients at risk for COVID-19 are in a state of rapid change based on information released by regulatory bodies including the CDC and federal and state organizations. These policies  and algorithms were followed during the patient's care in the ED.   23 year old female who presents with several week history of intermittent chest pain. Differential diagnosis includes, but is not limited to, ACS, aortic dissection, pulmonary embolism, cardiac tamponade, pneumothorax, pneumonia, pericarditis, myocarditis, GI-related causes including esophagitis/gastritis, and musculoskeletal chest wall pain.    We will check LFTs and lipase.  Initial EKG and troponin unremarkable.  Will check d-dimer, chest x-ray.  Give Toradol for discomfort and reassess.  Clinical Course as of Jan 29 553  Wed Jan 30, 2019  0434 Patient is difficult venous stick.  Desired straight stick for d-dimer and declined Toradol IM.   [JS]  0518 Updated patient on all test results.   Patient reports anxiety is contributing to her symptoms.  Will discharge home with limited prescription for anxiolytic and patient will follow-up with her PCP.  Strict return precautions given.  Patient verbalizes understanding and agrees with plan of care.   [JS]    Clinical Course User Index [JS] Irean HongSung, Evie Crumpler J, MD     ____________________________________________   FINAL CLINICAL IMPRESSION(S) / ED DIAGNOSES  Final diagnoses:  Atypical chest pain  Chest wall pain  Anxiety     ED Discharge Orders         Ordered    LORazepam (ATIVAN) 1 MG tablet  Every 8 hours PRN     01/30/19 0519           Note:  This document was prepared using Dragon voice recognition software and may include unintentional dictation errors.   Irean HongSung, Nihira Puello J, MD 01/30/19 [id number]

## 2019-06-26 ENCOUNTER — Emergency Department (HOSPITAL_COMMUNITY)
Admission: EM | Admit: 2019-06-26 | Discharge: 2019-06-26 | Disposition: A | Discharge status: Home / Self Care | Payer: Self-pay | Attending: Emergency Medicine | Admitting: Emergency Medicine

## 2019-06-26 ENCOUNTER — Encounter (HOSPITAL_COMMUNITY): Payer: Self-pay | Admitting: Emergency Medicine

## 2019-06-26 ENCOUNTER — Other Ambulatory Visit: Payer: Self-pay

## 2019-06-26 DIAGNOSIS — R200 Anesthesia of skin: Secondary | ICD-10-CM | POA: Insufficient documentation

## 2019-06-26 DIAGNOSIS — R202 Paresthesia of skin: Secondary | ICD-10-CM | POA: Insufficient documentation

## 2019-06-26 DIAGNOSIS — F419 Anxiety disorder, unspecified: Secondary | ICD-10-CM | POA: Insufficient documentation

## 2019-06-26 LAB — COMPREHENSIVE METABOLIC PANEL
ALT: 26 U/L (ref 0–44)
AST: 27 U/L (ref 15–41)
Albumin: 3.4 g/dL — ABNORMAL LOW (ref 3.5–5.0)
Alkaline Phosphatase: 80 U/L (ref 38–126)
Anion gap: 11 (ref 5–15)
BUN: 8 mg/dL (ref 6–20)
CO2: 25 mmol/L (ref 22–32)
Calcium: 9.2 mg/dL (ref 8.9–10.3)
Chloride: 102 mmol/L (ref 98–111)
Creatinine, Ser: 0.59 mg/dL (ref 0.44–1.00)
GFR calc Af Amer: >60 mL/min (ref >60)
GFR calc non Af Amer: >60 mL/min (ref >60)
Glucose, Bld: 93 mg/dL (ref 70–99)
Potassium: 3.8 mmol/L (ref 3.5–5.1)
Sodium: 138 mmol/L (ref 135–145)
Total Bilirubin: 0.2 mg/dL — ABNORMAL LOW (ref 0.3–1.2)
Total Protein: 7.4 g/dL (ref 6.5–8.1)

## 2019-06-26 LAB — CBC
HCT: 45 % (ref 36.0–46.0)
Hemoglobin: 12.3 g/dL (ref 12.0–15.0)
MCH: 23.7 pg — ABNORMAL LOW (ref 26.0–34.0)
MCHC: 27.3 g/dL — ABNORMAL LOW (ref 30.0–36.0)
MCV: 86.7 fL (ref 80.0–100.0)
Platelets: 355 10*3/uL (ref 150–400)
RBC: 5.19 MIL/uL — ABNORMAL HIGH (ref 3.87–5.11)
RDW: 15.7 % — ABNORMAL HIGH (ref 11.5–15.5)
WBC: 8.7 10*3/uL (ref 4.0–10.5)
nRBC: 0 % (ref 0.0–0.2)

## 2019-06-26 MED ORDER — HYDROXYZINE HCL 25 MG PO TABS: 25.0000 mg | ORAL_TABLET | Freq: Four times a day (QID) | ORAL | 0 refills | Status: DC | PRN

## 2019-06-26 MED ORDER — HYDROXYZINE HCL 25 MG PO TABS
25.0000 mg | ORAL_TABLET | Freq: Once | ORAL | Status: AC
  Administered 2019-06-26: 17:00:00 25 mg via ORAL
  Filled 2019-06-26: qty 1

## 2019-06-26 NOTE — ED Triage Notes (Signed)
Pt here from home with c/o head tingling and arm tingling , pt does have a hx of anemia ,but has not had her blood work done in a couple of months

## 2019-06-26 NOTE — ED Provider Notes (Signed)
[place] EMERGENCY DEPARTMENT Provider Note   CSN: [id number] Arrival date & time: 06/26/19  1322     History   Chief Complaint No chief complaint on file.   HPI Elizabeth Simon is a 23 y.o. female with a past medical history significant for anemia, anxiety, and borderline personality disorder who presents to the ED due to intermittent bilateral hand and feet tingling since Saturday. She notes she has a history of iron deficiency anemia, but has not been taking her iron supplements for the past few months. She has not tried anything at home for the issue except drinking extra water. Patient also notes intermittent, dull, 2/10 right-sided headache since Saturday. No associative vision changes, nausea, photophobia, or vomiting. She notes chronic shortness of breath and central, substernal chest pain that has not changed in character since her last ED visit on 01/30/2019. She believes her shortness of breath and chest pain are related to her anxiety. Patient denies recent travel, hx of cancer, hormonal therapy, and recent surgery. Patient denies abdominal pain, lower extremity edema, lower extremity numbness/tingling.   Past Medical History:  Diagnosis Date   Anemia    Anxiety    Borderline personality disorder ([place])     There are no active problems to display for this patient.   No past surgical history on file.   OB History   No obstetric history on file.      Home Medications    Prior to Admission medications   Medication Sig Start Date End Date Taking? Authorizing Provider  amoxicillin (AMOXIL) 500 MG tablet Take 1 tablet (500 mg total) by mouth 3 (three) times daily. 01/08/19   Triplett, Johnette Abraham B, FNP  amoxicillin-clavulanate (AUGMENTIN) 875-125 MG tablet Take 1 tablet by mouth 2 (two) times daily. 01/16/19   Hassell Done, Mary-Margaret, FNP  famotidine (PEPCID) 20 MG tablet Take 1 tablet (20 mg total) by mouth 2 (two) times daily for 30 days. 01/08/19 02/07/19   Triplett, Dessa Phi, FNP  fluticasone (FLONASE) 50 MCG/ACT nasal spray Place 2 sprays into both nostrils daily for 30 days. 01/08/19 02/07/19  Triplett, Johnette Abraham B, FNP  hydrOXYzine (ATARAX/VISTARIL) 25 MG tablet Take 1 tablet (25 mg total) by mouth every 6 (six) hours as needed. 06/26/19   Cheek, Comer Locket, PA-C  LORazepam (ATIVAN) 1 MG tablet Take 1 tablet (1 mg total) by mouth every 8 (eight) hours as needed for anxiety. 01/30/19   Paulette Blanch, MD    Family History No family history on file.  Social History Social History   Tobacco Use   Smoking status: Current Some Day Smoker   Smokeless tobacco: Never Used  Substance Use Topics   Alcohol use: Yes    Frequency: Never   Drug use: Not Currently     Allergies   Patient has no known allergies.   Review of Systems Review of Systems  Constitutional: Negative for chills and fever.  Respiratory: Positive for shortness of breath (chronic).   Cardiovascular: Positive for chest pain (chronic). Negative for palpitations and leg swelling.  Neurological: Positive for numbness and headaches. Negative for dizziness, syncope and light-headedness.  Psychiatric/Behavioral: The patient is nervous/anxious.      Physical Exam Updated Vital Signs BP 126/71 (BP Location: Right Arm)    Pulse 87    Temp 98.8 F (37.1 C) (Oral)    Resp 16    SpO2 100%   Physical Exam Vitals signs and nursing note reviewed.  Constitutional:  General: She is not in acute distress.    Appearance: She is obese. She is not ill-appearing.  HENT:     Head: Normocephalic.     Nose:     Comments: Tenderness to palpation over right maxillary sinus Eyes:     Pupils: Pupils are equal, round, and reactive to light.  Neck:     Musculoskeletal: Normal range of motion and neck supple.  Cardiovascular:     Rate and Rhythm: Normal rate and regular rhythm.     Pulses: Normal pulses.     Heart sounds: Normal heart sounds. No murmur. No friction rub. No gallop.     Pulmonary:     Effort: Pulmonary effort is normal.     Breath sounds: Normal breath sounds.     Comments: Clear to auscultation bilaterally Abdominal:     General: Abdomen is flat. Bowel sounds are normal. There is no distension.     Palpations: Abdomen is soft.  Musculoskeletal:     Comments: Able to move all 4 extremities without difficulty.  Skin:    General: Skin is warm and dry.     Capillary Refill: Capillary refill takes less than 2 seconds.  Neurological:     General: No focal deficit present.     Mental Status: She is alert.      ED Treatments / Results  Labs (all labs ordered are listed, but only abnormal results are displayed) Labs Reviewed  CBC - Abnormal; Notable for the following components:      Result Value   RBC 5.19 (*)    MCH 23.7 (*)    MCHC 27.3 (*)    RDW 15.7 (*)    All other components within normal limits  COMPREHENSIVE METABOLIC PANEL - Abnormal; Notable for the following components:   Albumin 3.4 (*)    Total Bilirubin 0.2 (*)    All other components within normal limits    EKG None  Radiology No results found.  Procedures Procedures (including critical care time)  Medications Ordered in ED Medications  hydrOXYzine (ATARAX/VISTARIL) tablet 25 mg (25 mg Oral Given 06/26/19 1651)     Initial Impression / Assessment and Plan / ED Course  I have reviewed the triage vital signs and the nursing notes.  Pertinent labs & imaging results that were available during my care of the patient were reviewed by me and considered in my medical decision making (see chart for details).       23 year old female presents to ED with bilateral hand and feet tingling since Saturday. History of anemia, but has not been compliant with iron supplements for the past few months. Vitals reviewed and within normal limits. Patient is in no acute distress and non-ill appearing. Lungs clear to auscultation bilaterally. Normal heart sounds. Pulses and sensation  intact bilaterally. CBC and CMP ordered at triage. Suspect numbness/tingling could be related to anemia, undiagnosed diabetes, or anxiety. Discussed with patient her chronic shortness of breath and chest pain. Reviewed previous records and previous workup for chest pain. Patient notes chest pain and shortness of breath have been a chronic issue with no change in character. Patient does not want to be worked up further for chest pain and shortness of breath. PERC negative. Low suspicion for PE. Given issue has been chronic for numerous months, low suspicion for ACS, dissection, or tamponade.   CBC with no leukocytosis. Normal hemoglobin. CMP unremarkable with no hyperglycemia or electrolyte derangements. Normal renal functioning. Patient notes she has been  diagnosed with anxiety; however has not been taking her medication due to addiction. She notes her symptoms could be related to anxiety. Will try 1 does of vistaril.  Upon reevaluation, patient notes all of her symptoms have improved with Vistaril. Suspect issues related to anxiety. Will send patient home with a few doses of Vistaril. Patient given PCP number for further follow-up. Strict ED precautions discussed with patient. Patient states understanding and agrees to plan. Patient discharged home in no acute distress and vitals within normal limits.  Final Clinical Impressions(s) / ED Diagnoses   Final diagnoses:  Numbness and tingling in both hands  Anxiety    ED Discharge Orders         Ordered    hydrOXYzine (ATARAX/VISTARIL) 25 MG tablet  Every 6 hours PRN,   Status:  Discontinued     06/26/19 1723    hydrOXYzine (ATARAX/VISTARIL) 25 MG tablet  Every 6 hours PRN     06/26/19 1726           Renee HarderCheek, Mikaela Hilgeman B, PA-C 06/26/19 2114    Melene PlanFloyd, Dan, DO 06/26/19 2226

## 2019-06-26 NOTE — ED Notes (Signed)
Pt verbalized understanding of discharge instructions. Prescriptions reviewed, no further questions at this time 

## 2019-06-26 NOTE — Discharge Instructions (Addendum)
As discussed, your labs today look good. Your hemoglobin is normal. I have provided you with a number for a primary care doctor. I recommend calling tomorrow to schedule an appointment to establish care. I am sending you home with 12 hydroxyzine tablets. You may take them as needed every 6 hours for anxiety. Return to the ER with new or worsening symptoms.

## 2019-07-08 NOTE — Progress Notes (Signed)
Patient ID: Elizabeth Simon, female   DOB: [birth year], 23 y.o.   MRN: [id number]  Virtual Visit via Telephone Note  I connected with Elizabeth Simon on 07/09/19 at  2:30 PM EST by telephone and verified that I am speaking with the correct person using two identifiers.   I discussed the limitations, risks, security and privacy concerns of performing an evaluation and management service by telephone and the availability of in person appointments. I also discussed with the patient that there may be a patient responsible charge related to this service. The patient expressed understanding and agreed to proceed.   Patient location:  home My Location:  [facility] office Persons on the call:  Me and the patient   History of Present Illness: after ED visit 06/26/2019.  Paresthesias in hands last about 1 hour and occur about every other day. Vistaril helped in ED.  She has a h/o mood d/o and being on abilify, seroquel, and prozac.  She is not sure if any of those helped.  She hasn't really taken anything in ~ 5years.  She denies SI/HI.  She has been worrying more over the last few months.  Recently got a new job and will have insurance in January.  She feels happy about that.    Elizabeth Simon is a 23 y.o. female with a past medical history significant for anemia, anxiety, and borderline personality disorder who presents to the ED due to intermittent bilateral hand and feet tingling since Saturday. She notes she has a history of iron deficiency anemia, but has not been taking her iron supplements for the past few months. She has not tried anything at home for the issue except drinking extra water. Patient also notes intermittent, dull, 2/10 right-sided headache since Saturday. No associative vision changes, nausea, photophobia, or vomiting. She notes chronic shortness of breath and central, substernal chest pain that has not changed in character since her last ED visit on 01/30/2019. She believes her  shortness of breath and chest pain are related to her anxiety. Patient denies recent travel, hx of cancer, hormonal therapy, and recent surgery. Patient denies abdominal pain, lower extremity edema, lower extremity numbness/tingling.   23 year old female presents to ED with bilateral hand and feet tingling since Saturday. History of anemia, but has not been compliant with iron supplements for the past few months. Vitals reviewed and within normal limits. Patient is in no acute distress and non-ill appearing. Lungs clear to auscultation bilaterally. Normal heart sounds. Pulses and sensation intact bilaterally. CBC and CMP ordered at triage. Suspect numbness/tingling could be related to anemia, undiagnosed diabetes, or anxiety. Discussed with patient her chronic shortness of breath and chest pain. Reviewed previous records and previous workup for chest pain. Patient notes chest pain and shortness of breath have been a chronic issue with no change in character. Patient does not want to be worked up further for chest pain and shortness of breath. PERC negative. Low suspicion for PE. Given issue has been chronic for numerous months, low suspicion for ACS, dissection, or tamponade.   CBC with no leukocytosis. Normal hemoglobin. CMP unremarkable with no hyperglycemia or electrolyte derangements. Normal renal functioning. Patient notes she has been diagnosed with anxiety; however has not been taking her medication due to addiction. She notes her symptoms could be related to anxiety. Will try 1 does of vistaril.  Upon reevaluation, patient notes all of her symptoms have improved with Vistaril. Suspect issues related to anxiety. Will send patient  home with a few doses of Vistaril. Patient given PCP number for further follow-up. Strict ED precautions discussed with patient. Patient states understanding and agrees to plan. Patient discharged home in no acute distress and vitals within normal limits.    Observations/Objective:  NAD.  TP Linear.  A&Ox3   Assessment and Plan: 1. Anxiety She does not want to start other meds at this time.  We can refer to psych once insurance is in place.   - hydrOXYzine (ATARAX/VISTARIL) 25 MG tablet; Take 1 tablet (25 mg total) by mouth every 6 (six) hours as needed. Prn anxiety  Dispense: 30 tablet; Refill: 1 - Ambulatory referral to Social Work  2. Encounter for examination following treatment at hospital stable     Follow Up Instructions: Assign PCP in about 6 weeks   I discussed the assessment and treatment plan with the patient. The patient was provided an opportunity to ask questions and all were answered. The patient agreed with the plan and demonstrated an understanding of the instructions.   The patient was advised to call back or seek an in-person evaluation if the symptoms worsen or if the condition fails to improve as anticipated.  I provided 14 minutes of non-face-to-face time during this encounter.   Georgian Co, PA-C

## 2019-07-09 ENCOUNTER — Ambulatory Visit: Payer: Self-pay | Attending: Family Medicine | Admitting: Physician Assistant

## 2019-07-09 ENCOUNTER — Other Ambulatory Visit: Payer: Self-pay

## 2019-07-09 DIAGNOSIS — F419 Anxiety disorder, unspecified: Secondary | ICD-10-CM

## 2019-07-09 DIAGNOSIS — Z09 Encounter for follow-up examination after completed treatment for conditions other than malignant neoplasm: Secondary | ICD-10-CM

## 2019-07-09 MED ORDER — HYDROXYZINE HCL 25 MG PO TABS: 25.0000 mg | ORAL_TABLET | Freq: Four times a day (QID) | ORAL | 1 refills | Status: DC | PRN

## 2019-07-09 NOTE — Progress Notes (Signed)
Patient verified DOB Patient has eaten today Patient has not taken medication today. Patient complains of right side rib cage discomfort since June.

## 2019-07-10 ENCOUNTER — Telehealth (INDEPENDENT_AMBULATORY_CARE_PROVIDER_SITE_OTHER): Payer: Self-pay | Admitting: Licensed Clinical Social Worker

## 2019-07-10 NOTE — Telephone Encounter (Signed)
Call placed to patient regarding IBH referral. LCSW left message requesting return call.  

## 2019-07-30 ENCOUNTER — Telehealth: Payer: Self-pay | Admitting: *Deleted

## 2019-07-30 NOTE — Telephone Encounter (Signed)
Patient needs 6 week establish care with a PCP from 07/09/2019 visit.

## 2019-08-23 ENCOUNTER — Telehealth: Payer: Self-pay | Admitting: Licensed Clinical Social Worker

## 2019-08-23 NOTE — Telephone Encounter (Signed)
Call placed to patient.   LCSW introduced self and explained role at [facility]. Pt was informed that a referral was placed by last seen provider. Pt agreed to schedule IBH appointment for 09/04/19.   No additional concerns noted.

## 2019-09-04 ENCOUNTER — Other Ambulatory Visit: Payer: Self-pay

## 2019-09-04 ENCOUNTER — Ambulatory Visit: Payer: Self-pay | Attending: Family Medicine | Admitting: Licensed Clinical Social Worker

## 2019-09-04 DIAGNOSIS — F332 Major depressive disorder, recurrent severe without psychotic features: Secondary | ICD-10-CM

## 2019-09-04 DIAGNOSIS — F411 Generalized anxiety disorder: Secondary | ICD-10-CM

## 2019-09-11 ENCOUNTER — Ambulatory Visit: Payer: Self-pay | Attending: Family Medicine | Admitting: Licensed Clinical Social Worker

## 2019-09-11 ENCOUNTER — Other Ambulatory Visit: Payer: Self-pay

## 2019-09-11 DIAGNOSIS — F411 Generalized anxiety disorder: Secondary | ICD-10-CM

## 2019-09-11 DIAGNOSIS — F332 Major depressive disorder, recurrent severe without psychotic features: Secondary | ICD-10-CM

## 2019-09-11 NOTE — BH Specialist Note (Signed)
Integrated Behavioral Health Visit via Telemedicine (Telephone)  09/11/2019 Elizabeth Simon [id number]   Session Start time: 9:40 AM  Session End time: 9:55 AM Total time: 15  Referring Provider: Trena Platt Type of Visit: Telephonic Patient location: Home [facility] Provider location: Office All persons participating in visit: LCSW and Patient  Confirmed patient's address: Yes  Confirmed patient's phone number: Yes  Any changes to demographics: No   Confirmed patient's insurance: Yes  Any changes to patient's insurance: No   Discussed confidentiality: Yes    The following statements were read to the patient and/or legal guardian that are established with the [facility] Provider.  "The purpose of this phone visit is to provide behavioral health care while limiting exposure to the coronavirus (COVID19).  There is a possibility of technology failure and discussed alternative modes of communication if that failure occurs."  "By engaging in this telephone visit, you consent to the provision of healthcare.  Additionally, you authorize for your insurance to be billed for the services provided during this telephone visit."   Patient and/or legal guardian consented to telephone visit: Yes   PRESENTING CONCERNS: Patient and/or family reports the following symptoms/concerns: Pt reports difficulty managing mental health conditions triggered by stressful working environment and limited support Duration of problem: Ongoing, Pt has been diagnosed with Depression, Anxiety, Mood Disorder, and ADHD; Severity of problem: severe  STRENGTHS (Protective Factors/Coping Skills): Pt has good insight Pt has desire to change Pt is open to services  GOALS ADDRESSED: Patient will: 1.  Reduce symptoms of: anxiety and depression  2.  Increase knowledge and/or ability of: self-management skills  3.  Demonstrate ability to: Increase adequate support systems for  patient/family  INTERVENTIONS: Interventions utilized:  Solution-Focused Strategies Standardized Assessments completed: Not Needed  ASSESSMENT: Patient currently experiencing depression and anxiety triggered by stressful working environment, limited support, and hx of substance use  Patient may benefit from psychotherapy and medication management. Pt shared that she has yet to contact [facility] due to busy work schedule. LCSW provided different options to schedule appointment with [facility], including walk-in, online, or by phone  PLAN: 1. Follow up with behavioral health clinician on : 09/18/2019 2. Behavioral recommendations: Initiate services with [facility] 3. Referral(s): [facility] (LME/Outside Clinic)  Bridgett Larsson, [place] 09/13/2019 6:03 PM

## 2019-09-13 NOTE — BH Specialist Note (Signed)
Integrated Behavioral Health Visit via Telemedicine (Telephone)  09/04/2019 Elizabeth Simon [id number]   Session Start time: 8:35 AM  Session End time: 9:20 AM Total time: 45   Referring Provider: Trena Platt Type of Visit: Telephonic Patient location: Home [facility] Provider location: Office All persons participating in visit: LCSW and Patient  Confirmed patient's address: Yes  Confirmed patient's phone number: Yes  Any changes to demographics: No   Confirmed patient's insurance: Yes  Any changes to patient's insurance: No   Discussed confidentiality: Yes    The following statements were read to the patient and/or legal guardian that are established with the [facility] Provider.  "The purpose of this phone visit is to provide behavioral health care while limiting exposure to the coronavirus (COVID19).  There is a possibility of technology failure and discussed alternative modes of communication if that failure occurs."  "By engaging in this telephone visit, you consent to the provision of healthcare.  Additionally, you authorize for your insurance to be billed for the services provided during this telephone visit."   Patient and/or legal guardian consented to telephone visit: Yes   PRESENTING CONCERNS: Patient and/or family reports the following symptoms/concerns: Pt reports difficulty managing mental health conditions triggered by stressful working environment and limited support Duration of problem: Ongoing, Pt has been diagnosed with Depression, Anxiety, Mood Disorder, and ADHD; Severity of problem: severe  STRENGTHS (Protective Factors/Coping Skills): Pt has good insight Pt has desire to change Pt is open to services  GOALS ADDRESSED: Patient will: 1.  Reduce symptoms of: agitation, anxiety, depression and stress  2.  Increase knowledge and/or ability of: coping skills and healthy habits  3.  Demonstrate ability to: Increase healthy adjustment to current life  circumstances, Increase adequate support systems for patient/family and Decrease self-medicating behaviors  INTERVENTIONS: Interventions utilized:  Solution-Focused Strategies, Supportive Counseling, Psychoeducation and/or Health Education and Link to [employer] Standardized Assessments completed: Not Needed  ASSESSMENT: Patient currently experiencing depression and anxiety triggered by stressful working environment, limited support, and hx of substance use. Increase in the following symptoms were reported by pt: panic attacks, decreased quality in sleep, racing thoughts, binge eating, nightmares, and difficulty with memory. Pt has hx of inpatient psychiatric hold (most recent last year) and has participated in substance use treatment mid year in 2020   Patient may benefit from psychotherapy and medication management. Validation and encouragement provided. LCSW discussed correlation between one's physical and mental health, in addition, to how stress impacts both. Pt participates in group chat and receives additional support from boyfriend. She is interested in establishing behavioral health services through [facility].   PLAN: 1. Follow up with behavioral health clinician on : Follow up appointment scheduled for 09/11/2019 2. Behavioral recommendations: Continue to utilize healthy coping skills and establish services through [facility] 3. Referral(s): [facility] (LME/Outside Clinic)  Bridgett Larsson, [place] 09/13/19 10:11 AM

## 2019-09-18 ENCOUNTER — Telehealth: Payer: Self-pay | Admitting: Licensed Clinical Social Worker

## 2019-09-18 ENCOUNTER — Ambulatory Visit: Payer: Self-pay | Attending: Family Medicine | Admitting: Licensed Clinical Social Worker

## 2019-09-18 ENCOUNTER — Other Ambulatory Visit: Payer: Self-pay

## 2019-09-18 NOTE — Telephone Encounter (Signed)
Call placed to patient regarding scheduled IBH appointment. LCSW left message for a return call.  

## 2019-12-02 ENCOUNTER — Emergency Department (HOSPITAL_COMMUNITY)
Admission: EM | Admit: 2019-12-02 | Discharge: 2019-12-02 | Disposition: A | Discharge status: Home / Self Care | Payer: 59 | Attending: Emergency Medicine | Admitting: Emergency Medicine

## 2019-12-02 ENCOUNTER — Encounter (HOSPITAL_COMMUNITY): Payer: Self-pay | Admitting: Emergency Medicine

## 2019-12-02 ENCOUNTER — Emergency Department (HOSPITAL_COMMUNITY): Payer: 59

## 2019-12-02 DIAGNOSIS — K219 Gastro-esophageal reflux disease without esophagitis: Secondary | ICD-10-CM

## 2019-12-02 DIAGNOSIS — J189 Pneumonia, unspecified organism: Secondary | ICD-10-CM | POA: Diagnosis not present

## 2019-12-02 DIAGNOSIS — R079 Chest pain, unspecified: Secondary | ICD-10-CM

## 2019-12-02 DIAGNOSIS — R7989 Other specified abnormal findings of blood chemistry: Secondary | ICD-10-CM

## 2019-12-02 DIAGNOSIS — F17200 Nicotine dependence, unspecified, uncomplicated: Secondary | ICD-10-CM | POA: Diagnosis not present

## 2019-12-02 DIAGNOSIS — Z79899 Other long term (current) drug therapy: Secondary | ICD-10-CM | POA: Insufficient documentation

## 2019-12-02 DIAGNOSIS — Z20822 Contact with and (suspected) exposure to covid-19: Secondary | ICD-10-CM | POA: Diagnosis not present

## 2019-12-02 DIAGNOSIS — R0602 Shortness of breath: Secondary | ICD-10-CM | POA: Diagnosis not present

## 2019-12-02 LAB — CBC
HCT: 36 % (ref 36.0–46.0)
Hemoglobin: 10.6 g/dL — ABNORMAL LOW (ref 12.0–15.0)
MCH: 21.9 pg — ABNORMAL LOW (ref 26.0–34.0)
MCHC: 29.4 g/dL — ABNORMAL LOW (ref 30.0–36.0)
MCV: 74.5 fL — ABNORMAL LOW (ref 80.0–100.0)
Platelets: 443 10*3/uL — ABNORMAL HIGH (ref 150–400)
RBC: 4.83 MIL/uL (ref 3.87–5.11)
RDW: 16.3 % — ABNORMAL HIGH (ref 11.5–15.5)
WBC: 15.8 10*3/uL — ABNORMAL HIGH (ref 4.0–10.5)
nRBC: 0 % (ref 0.0–0.2)

## 2019-12-02 LAB — BASIC METABOLIC PANEL
Anion gap: 13 (ref 5–15)
BUN: 6 mg/dL (ref 6–20)
CO2: 24 mmol/L (ref 22–32)
Calcium: 8.9 mg/dL (ref 8.9–10.3)
Chloride: 99 mmol/L (ref 98–111)
Creatinine, Ser: 0.63 mg/dL (ref 0.44–1.00)
GFR calc Af Amer: >60 mL/min (ref >60)
GFR calc non Af Amer: >60 mL/min (ref >60)
Glucose, Bld: 90 mg/dL (ref 70–99)
Potassium: 4.1 mmol/L (ref 3.5–5.1)
Sodium: 136 mmol/L (ref 135–145)

## 2019-12-02 LAB — POC SARS CORONAVIRUS 2 AG -  ED: SARS Coronavirus 2 Ag: NEGATIVE

## 2019-12-02 LAB — TROPONIN I (HIGH SENSITIVITY)
Troponin I (High Sensitivity): 3 ng/L (ref <18)
Troponin I (High Sensitivity): 7 ng/L (ref <18)

## 2019-12-02 LAB — D-DIMER, QUANTITATIVE: D-Dimer, Quant: 0.93 ug/mL-FEU — ABNORMAL HIGH (ref 0.00–0.50)

## 2019-12-02 MED ORDER — KETOROLAC TROMETHAMINE 30 MG/ML IJ SOLN
30.0000 mg | Freq: Once | INTRAMUSCULAR | Status: AC
  Administered 2019-12-02: 30 mg via INTRAVENOUS
  Filled 2019-12-02: qty 1

## 2019-12-02 MED ORDER — FLUTICASONE PROPIONATE 50 MCG/ACT NA SUSP: 2.0000 | Freq: Every day | NASAL | 2 refills | Status: AC

## 2019-12-02 MED ORDER — DEXAMETHASONE SODIUM PHOSPHATE 10 MG/ML IJ SOLN
8.0000 mg | Freq: Once | INTRAMUSCULAR | Status: AC
  Administered 2019-12-02: 15:00:00 8 mg via INTRAVENOUS
  Filled 2019-12-02: qty 1

## 2019-12-02 MED ORDER — IOHEXOL 350 MG/ML SOLN
80.0000 mL | Freq: Once | INTRAVENOUS | Status: AC | PRN
  Administered 2019-12-02: 80 mL via INTRAVENOUS

## 2019-12-02 MED ORDER — FAMOTIDINE 20 MG PO TABS: 40.0000 mg | ORAL_TABLET | Freq: Every day | ORAL | 0 refills | Status: AC

## 2019-12-02 MED ORDER — KETOROLAC TROMETHAMINE 30 MG/ML IJ SOLN: 30.0000 mg | Freq: Once | INTRAMUSCULAR | Status: DC

## 2019-12-02 MED ORDER — SODIUM CHLORIDE 0.9% FLUSH: 3.0000 mL | Freq: Once | INTRAVENOUS | Status: DC

## 2019-12-02 MED ORDER — DEXAMETHASONE SODIUM PHOSPHATE 10 MG/ML IJ SOLN: 8.0000 mg | Freq: Once | INTRAMUSCULAR | Status: DC

## 2019-12-02 MED ORDER — AMOXICILLIN 500 MG PO CAPS: 1000.0000 mg | ORAL_CAPSULE | Freq: Three times a day (TID) | ORAL | 0 refills | Status: AC

## 2019-12-02 NOTE — ED Triage Notes (Signed)
Pt reports having strep throat diagnosed this weekend and woke up with pain going from throat into chest with a deep breath.

## 2019-12-02 NOTE — ED Provider Notes (Signed)
Ultrasound ED Peripheral IV (Provider)  Date/Time: 12/02/2019 4:15 PM Performed by: Gailen Shelter, PA Authorized by: Gailen Shelter, PA   Procedure details:    Indications: poor IV access     Skin Prep: isopropyl alcohol     Location:  Left AC   Angiocath:  18 G   Bedside Ultrasound Guided: Yes     Images: not archived     Patient tolerated procedure without complications: Yes     Dressing applied: Yes        Gailen Shelter, PA 12/02/19 [id number]    Little, Ambrose Finland, MD 12/02/19 2307

## 2019-12-02 NOTE — ED Notes (Signed)
Patient verbalizes understanding of discharge instructions. Opportunity for questioning and answers were provided. Armband removed by staff, pt discharged from ED ambulatory.   

## 2019-12-02 NOTE — ED Provider Notes (Signed)
[facility] EMERGENCY DEPARTMENT Provider Note   CSN: [id number] Arrival date & time: 12/02/19  1055     History Chief Complaint  Patient presents with  . Chest Pain    Elizabeth Simon is a 24 y.o. female   HPI  Patient is a 24 year old female with past medical history of anemia, anxiety, borderline personality disorder presented today with chief complaint of chest pain.  Patient states that her chest pain is pleuritic, intermittent, waxing and waning in severity, she describes it as achy rather than sharp.  She denies any exertional component of chest pain.  Denies any radiation of chest pain.  She endorses some shortness of breath.  Denies any history of blood clots, states that she is on an estrogen-containing OCP.  She has been on this for 3 months.  She denies any lateral leg swelling, easy fatigue.  She states that she was diagnosed on Saturday with strep pharyngitis and has been on Augmentin since that time.  She states she has been taking this as prescribed.  She states that she woke up Saturday morning feeling somewhat short of breath.  She states that this has persisted.  She denies any aggravating or mitigating factors.  She denies any heart palpitations, nausea, vomiting, abdominal pain, headache, lightheadedness, dizziness, hemoptysis, recent surgery, immobilization, travel.  She denies any difficulty breathing currently.  States she is able to swallow without difficulty.  She states that her throat pain has been gradually improving since she has been on antibiotics.     Past Medical History:  Diagnosis Date  . Anemia   . Anxiety   . Borderline personality disorder ([place])     There are no problems to display for this patient.   No past surgical history on file.   OB History   No obstetric history on file.     No family history on file.  Social History   Tobacco Use  . Smoking status: Current Some Day Smoker  . Smokeless tobacco: Never  Used  Substance Use Topics  . Alcohol use: Yes    Comment: occ  . Drug use: Not Currently    Home Medications Prior to Admission medications   Medication Sig Start Date End Date Taking? Authorizing Provider  acetaminophen (TYLENOL) 500 MG tablet Take 500 mg by mouth every 6 (six) hours as needed for mild pain.   Yes [provider]  amoxicillin (AMOXIL) 500 MG capsule Take 500 mg by mouth 2 (two) times daily. 11/30/19  Yes [provider]  AUROVELA FE 1/20 1-20 MG-MCG tablet Take 1 tablet by mouth daily. 09/17/19  Yes [provider]  calcium carbonate (TUMS - DOSED IN MG ELEMENTAL CALCIUM) 500 MG chewable tablet Chew 1 tablet by mouth daily as needed for indigestion or heartburn.   Yes [provider]  Cimetidine (TAGAMET PO) Take 1 tablet by mouth daily.   Yes [provider]  ibuprofen (ADVIL) 200 MG tablet Take 400 mg by mouth every 6 (six) hours as needed for moderate pain.   Yes [provider]  Pseudoephedrine-APAP-DM (DAYQUIL PO) Take 15-30 mLs by mouth daily as needed (cough).   Yes [provider]  amoxicillin (AMOXIL) 500 MG capsule Take 2 capsules (1,000 mg total) by mouth 3 (three) times daily for 5 days. 12/02/19 12/07/19  Tedd Sias, PA  famotidine (PEPCID) 20 MG tablet Take 2 tablets (40 mg total) by mouth daily. 12/02/19   Tedd Sias, PA  fluticasone Asencion Islam)  50 MCG/ACT nasal spray Place 2 sprays into both nostrils daily. 12/02/19   Tedd Sias, PA    Allergies    Patient has no known allergies.  Review of Systems   Review of Systems  Constitutional: Negative for chills and fever.  HENT: Negative for congestion.   Eyes: Negative for pain.  Respiratory: Positive for shortness of breath. Negative for cough.   Cardiovascular: Negative for chest pain and leg swelling.  Gastrointestinal: Negative for abdominal pain and vomiting.  Genitourinary: Negative for dysuria.  Musculoskeletal: Negative for  myalgias.  Skin: Negative for rash.  Neurological: Negative for dizziness and headaches.    Physical Exam Updated Vital Signs BP (!) 142/84 (BP Location: Left Wrist)   Pulse 91   Temp 98.5 F (36.9 C) (Oral)   Resp 18   Ht '5\' 5"'$  (1.651 m)   Wt (!) 167.8 kg   SpO2 99%   BMI 61.57 kg/m   Physical Exam Vitals and nursing note reviewed.  Constitutional:      General: She is not in acute distress.    Appearance: Normal appearance. She is obese. She is not ill-appearing.  HENT:     Head: Normocephalic and atraumatic.     Nose: Nose normal.     Mouth/Throat:     Mouth: Mucous membranes are moist.     Pharynx: Oropharyngeal exudate and posterior oropharyngeal erythema present.     Comments: Tonsillar hypertrophy 2+ symmetric no uvular deviation.  Exudates present on bilateral tonsils.  Mild posterior pharynx erythema. Eyes:     General: No scleral icterus.       Right eye: No discharge.        Left eye: No discharge.     Conjunctiva/sclera: Conjunctivae normal.  Cardiovascular:     Rate and Rhythm: Regular rhythm. Tachycardia present.     Pulses: Normal pulses.     Heart sounds: Normal heart sounds.     Comments: 102 HR on my exam Pulmonary:     Effort: Pulmonary effort is normal. No respiratory distress.     Breath sounds: No stridor. No wheezing.  Abdominal:     Palpations: Abdomen is soft.     Tenderness: There is no abdominal tenderness. There is no guarding.     Hernia: No hernia is present.     Comments: Obese, nontender abdomen.  Musculoskeletal:     Cervical back: Normal range of motion.     Right lower leg: No edema.     Left lower leg: No edema.  Skin:    General: Skin is warm and dry.     Capillary Refill: Capillary refill takes less than 2 seconds.  Neurological:     Mental Status: She is alert and oriented to person, place, and time. Mental status is at baseline.  Psychiatric:        Mood and Affect: Mood normal.        Behavior: Behavior normal.      ED Results / Procedures / Treatments   Labs (all labs ordered are listed, but only abnormal results are displayed) Labs Reviewed  CBC - Abnormal; Notable for the following components:      Result Value   WBC 15.8 (*)    Hemoglobin 10.6 (*)    MCV 74.5 (*)    MCH 21.9 (*)    MCHC 29.4 (*)    RDW 16.3 (*)    Platelets 443 (*)    All other components within normal limits  D-DIMER, QUANTITATIVE (  NOT AT [facility]) - Abnormal; Notable for the following components:   D-Dimer, Quant 0.93 (*)    All other components within normal limits  BASIC METABOLIC PANEL  POC SARS CORONAVIRUS 2 AG -  ED  TROPONIN I (HIGH SENSITIVITY)  TROPONIN I (HIGH SENSITIVITY)    EKG EKG Interpretation  Date/Time:  Monday December 02 2019 11:30:03 EDT Ventricular Rate:  117 PR Interval:  154 QRS Duration: 80 QT Interval:  316 QTC Calculation: 440 R Axis:   100 Text Interpretation:   Suspect arm lead reversal, interpretation assumes no reversal Sinus tachycardia Rightward axis Cannot rule out Inferior infarct , age undetermined Abnormal ECG Confirmed by Davonna Belling [phone]) on 12/02/2019 2:56:17 PM   Radiology CT Angio Chest PE W/Cm &/Or Wo Cm  Result Date: 12/02/2019 CLINICAL DATA:  Chest pain, shortness of breath, positive D-dimer EXAM: CT ANGIOGRAPHY CHEST WITH CONTRAST TECHNIQUE: Multidetector CT imaging of the chest was performed using the standard protocol during bolus administration of intravenous contrast. Multiplanar CT image reconstructions and MIPs were obtained to evaluate the vascular anatomy. CONTRAST:  31m OMNIPAQUE IOHEXOL 350 MG/ML SOLN COMPARISON:  Chest radiograph dated 01/30/2019 FINDINGS: Cardiovascular: Evaluation of the bilateral lower lobe pulmonary arteries to the lobar level. Upper lobe pulmonary arteries are only evaluated centrally. Within that constraint, there is no evidence of pulmonary embolism. No evidence thoracic aortic aneurysm or dissection. Heart is normal in size.  No  pericardial effusion. Mediastinum/Nodes: No suspicious mediastinal lymphadenopathy. Visualized thyroid is unremarkable. Lungs/Pleura: Multifocal patchy/ground-glass opacities in the bilateral upper lobes, left greater than right, suspicious for multifocal pneumonia. No suspicious pulmonary nodules. No pleural effusion or pneumothorax. Upper Abdomen: Visualized upper abdomen is grossly unremarkable. Musculoskeletal: Mild degenerative changes of the mid thoracic spine. Review of the MIP images confirms the above findings. IMPRESSION: No evidence of pulmonary embolism. Multifocal pneumonia in the bilateral upper lobes, left greater than right. Electronically Signed   By: SJulian HyM.D.   On: 12/02/2019 19:18    Procedures Ultrasound ED Peripheral IV (Provider)  Date/Time: 12/02/2019 3:30 PM Performed by: FTedd Sias PA Authorized by: FTedd Sias PA   Procedure details:    Indications: multiple failed IV attempts     Skin Prep: chlorhexidine gluconate     Location:  Left AC   Angiocath:  18 G   Bedside Ultrasound Guided: Yes     Images: not archived     Patient tolerated procedure without complications: Yes     Dressing applied: Yes     (including critical care time)  Medications Ordered in ED Medications  sodium chloride flush (NS) 0.9 % injection 3 mL (has no administration in time range)  dexamethasone (DECADRON) injection 8 mg (8 mg Intravenous Given 12/02/19 1517)  ketorolac (TORADOL) 30 MG/ML injection 30 mg (30 mg Intravenous Given 12/02/19 1517)  iohexol (OMNIPAQUE) 350 MG/ML injection 80 mL (80 mLs Intravenous Contrast Given 12/02/19 1848)    ED Course  I have reviewed the triage vital signs and the nursing notes.  Pertinent labs & imaging results that were available during my care of the patient were reviewed by me and considered in my medical decision making (see chart for details).  Patient is 24year old female with past medical history pertinent for anemia  anxiety.  She presented today with chest pain her central chest that is pleuritic as well as shortness of breath.  She also has been recently diagnosed with strep throat with a positive PCR test and is on amoxicillin currently.  She is afebrile, denies any heart palpitations.  She is somewhat tachycardic however.  Concern for PE.  She is on birth control currently no other risk factors apart from obesity.  She intermittently smokes.  Physical exam is unremarkable apart from severe obesity. I doubt other causes of emergent shortness of breath such as: cardiac (AHF, pericardial effusion and tamponade, arrhythmias, ischemia, etc) Respiratory (COPD, asthma, pneumonia, pneumothorax, primary pulmonary hypertension) Hematological (anemia) Neuromuscular (ALS, Guillain-Barr, etc)  We will obtain D-dimer in addition to blood work already ordered in triage.  Clinical Course as of Dec 02 1934  Mon Dec 02, 2019  1501 CBC with mild leukocytosis at 15.8.  This is consistent with her current strep infection.  She was PCR +2 days ago.  Is currently on antibiotics.  Hemoglobin 10.6.  She has a history of anemia and states that she has in the past been on iron supplements.  She states she is not currently taking this.  Denies fatigue lightheadedness or dizziness.  She does endorse some shortness of breath which could be a sign of anemia however her hemoglobin is close enough to normal limits I recommend close follow-up with PCP.  She states that she has recently established insurance and will follow up with PCP as soon as possible.  CBC(!) [WF]  1502 BMP with no electrolyte abnormalities.  Kidney functions within normal limits.  Basic metabolic panel [WF]  2130 Troponin X1 within normal limits.  Troponin I (High Sensitivity) [WF]  1502 Rapid antigen negative for Covid  POC SARS Coronavirus 2 Ag-ED - Nasal Swab (BD Veritor Kit) [WF]  1502 Dimer elevated at 0.93.  Will obtain CTPA to rule out PE  D-dimer,  quantitative (not at [facility])(!) [WF]  1503 ED EKG within 10 minutes [WF]  1504 EKG with sinus rhythm/tachycardia.  Heart rate is 116.  No S1Q3T3.  There is right axis deviation. I reviewed EKG with Dr. Alvino Chapel.   [WF]    Clinical Course User Index [WF] Tedd Sias, PA   MDM Rules/Calculators/A&P                      CTPA shows no pulmonary embolism.  Discussed with patient results of her CT scan.  As there is some evidence of possible upper airway pneumonia I will provide patient with additional dosage of amoxicillin in order to reach should be 1 g 3 times daily for 5-day treatment for Pneumonia.  She is otherwise healthy has no chronic heart, lung, liver, renal disease or DM or alcoholism or malignancy and therefore can be treated as an outpatient with amoxicillin as monotherapy.  Her vital signs are within normal limits she has no fever.  Her tachycardia has resolved.  SPO2 is 99% on room air.  As PE has been ruled out patient is cleared for discharge at this time.  I gave her strict return precautions.  She is understanding of plan to discharge.  She is also requesting treatment for her reflux which is chronic as well as her chronic runny nose.  I prescribed her fluticasone and Pepcid.    Final Clinical Impression(s) / ED Diagnoses Final diagnoses:  Chest pain, unspecified type  Shortness of breath  Positive D dimer  Community acquired pneumonia, unspecified laterality  Gastroesophageal reflux disease, unspecified whether esophagitis present    Rx / DC Orders ED Discharge Orders         Ordered    fluticasone (FLONASE) 50 MCG/ACT nasal spray  Daily  12/02/19 1930    amoxicillin (AMOXIL) 500 MG capsule  3 times daily     12/02/19 1930    famotidine (PEPCID) 20 MG tablet  Daily     12/02/19 1930           Tedd Sias, [place] 12/02/19 1936    Davonna Belling, MD 12/03/19 [phone]

## 2019-12-02 NOTE — Discharge Instructions (Addendum)
Please take 2 tablets of amoxicillin 3 times daily for the next 5 days.  You will have an additional 10 tablets of amoxicillin left over which you may dispose of.   Please use fluticasone and Pepcid as prescribed.  Please follow-up with your primary care doctor.

## 2020-03-01 ENCOUNTER — Other Ambulatory Visit: Payer: Self-pay

## 2020-03-01 DIAGNOSIS — R509 Fever, unspecified: Secondary | ICD-10-CM | POA: Diagnosis present

## 2020-03-01 DIAGNOSIS — R000 Tachycardia, unspecified: Secondary | ICD-10-CM | POA: Diagnosis not present

## 2020-03-01 DIAGNOSIS — R030 Elevated blood-pressure reading, without diagnosis of hypertension: Secondary | ICD-10-CM | POA: Diagnosis not present

## 2020-03-01 DIAGNOSIS — J029 Acute pharyngitis, unspecified: Secondary | ICD-10-CM | POA: Insufficient documentation

## 2020-03-01 DIAGNOSIS — F17200 Nicotine dependence, unspecified, uncomplicated: Secondary | ICD-10-CM | POA: Diagnosis not present

## 2020-03-01 DIAGNOSIS — E860 Dehydration: Secondary | ICD-10-CM | POA: Diagnosis not present

## 2020-03-02 ENCOUNTER — Emergency Department (HOSPITAL_COMMUNITY)
Admission: EM | Admit: 2020-03-02 | Discharge: 2020-03-02 | Disposition: A | Discharge status: Home / Self Care | Payer: 59 | Attending: Emergency Medicine | Admitting: Emergency Medicine

## 2020-03-02 ENCOUNTER — Other Ambulatory Visit: Payer: Self-pay

## 2020-03-02 DIAGNOSIS — J029 Acute pharyngitis, unspecified: Secondary | ICD-10-CM

## 2020-03-02 DIAGNOSIS — R030 Elevated blood-pressure reading, without diagnosis of hypertension: Secondary | ICD-10-CM

## 2020-03-02 LAB — GROUP A STREP BY PCR: Group A Strep by PCR: NOT DETECTED

## 2020-03-02 MED ORDER — DEXAMETHASONE 10 MG/ML FOR PEDIATRIC ORAL USE
10.0000 mg | Freq: Once | INTRAMUSCULAR | Status: AC
  Administered 2020-03-02: 10 mg via ORAL
  Filled 2020-03-02: qty 1

## 2020-03-02 MED ORDER — ACETAMINOPHEN 500 MG PO TABS
1000.0000 mg | ORAL_TABLET | Freq: Once | ORAL | Status: AC
  Administered 2020-03-02: 1000 mg via ORAL
  Filled 2020-03-02: qty 2

## 2020-03-02 NOTE — ED Triage Notes (Addendum)
Per pt she started having tonsil stones 2 days ago then today started having chills, fevers, body aches. Pt does have low grade temp at triage. Sinus pressure in her right nasal area.

## 2020-03-02 NOTE — ED Provider Notes (Signed)
[facility] EMERGENCY DEPARTMENT Provider Note   CSN: [id number] Arrival date & time: 03/01/20  2359     History Chief Complaint  Patient presents with  . Sore Throat  . Fever    Elizabeth Simon is a 24 y.o. female.  Patient presents with sore throat for 2 days with feeling feverish, body aches. Patient's had strep throat in the past. No significant sick contacts, no Covid contacts known. Tolerating oral liquids. Patient denies any respiratory symptoms.        Past Medical History:  Diagnosis Date  . Anemia   . Anxiety   . Borderline personality disorder ([facility])     There are no problems to display for this patient.   No past surgical history on file.   OB History   No obstetric history on file.     No family history on file.  Social History   Tobacco Use  . Smoking status: Current Some Day Smoker  . Smokeless tobacco: Never Used  Vaping Use  . Vaping Use: Never used  Substance Use Topics  . Alcohol use: Yes    Comment: occ  . Drug use: Not Currently    Home Medications Prior to Admission medications   Medication Sig Start Date End Date Taking? Authorizing Provider  acetaminophen (TYLENOL) 500 MG tablet Take 500 mg by mouth every 6 (six) hours as needed for mild pain.    [provider]  amoxicillin (AMOXIL) 500 MG capsule Take 500 mg by mouth 2 (two) times daily. 11/30/19   [provider]  AUROVELA FE 1/20 1-20 MG-MCG tablet Take 1 tablet by mouth daily. 09/17/19   [provider]  calcium carbonate (TUMS - DOSED IN MG ELEMENTAL CALCIUM) 500 MG chewable tablet Chew 1 tablet by mouth daily as needed for indigestion or heartburn.    [provider]  Cimetidine (TAGAMET PO) Take 1 tablet by mouth daily.    [provider]  famotidine (PEPCID) 20 MG tablet Take 2 tablets (40 mg total) by mouth daily. 12/02/19   Fondaw, Rodrigo Ran, PA  fluticasone (FLONASE) 50 MCG/ACT nasal spray Place 2 sprays into  both nostrils daily. 12/02/19   Gailen Shelter, PA  ibuprofen (ADVIL) 200 MG tablet Take 400 mg by mouth every 6 (six) hours as needed for moderate pain.    [provider]  Pseudoephedrine-APAP-DM (DAYQUIL PO) Take 15-30 mLs by mouth daily as needed (cough).    [provider]    Allergies    Patient has no known allergies.  Review of Systems   Review of Systems  Constitutional: Positive for fever. Negative for chills.  HENT: Positive for sore throat. Negative for congestion.   Eyes: Negative for visual disturbance.  Respiratory: Negative for shortness of breath.   Cardiovascular: Negative for chest pain.  Gastrointestinal: Negative for abdominal pain and vomiting.  Genitourinary: Negative for dysuria and flank pain.  Musculoskeletal: Negative for back pain, neck pain and neck stiffness.  Skin: Negative for rash.  Neurological: Negative for light-headedness and headaches.    Physical Exam Updated Vital Signs BP (!) 144/92 (BP Location: Right Arm)   Pulse 96   Temp 97.7 F (36.5 C) (Temporal)   Resp 18   SpO2 100%   Physical Exam Vitals and nursing note reviewed.  Constitutional:      Appearance: She is well-developed.  HENT:     Head: Normocephalic and atraumatic.     Nose: No congestion.  Mouth/Throat:     Pharynx: Posterior oropharyngeal erythema present. No oropharyngeal exudate.     Tonsils: No tonsillar abscesses.  Eyes:     General:        Right eye: No discharge.        Left eye: No discharge.     Conjunctiva/sclera: Conjunctivae normal.  Neck:     Trachea: No tracheal deviation.  Cardiovascular:     Rate and Rhythm: Normal rate.  Pulmonary:     Effort: Pulmonary effort is normal.  Abdominal:     General: There is no distension.     Palpations: Abdomen is soft.  Musculoskeletal:     Cervical back: Normal range of motion and neck supple.  Skin:    General: Skin is warm.     Findings: No rash.  Neurological:     Mental Status:  She is alert and oriented to person, place, and time.     ED Results / Procedures / Treatments   Labs (all labs ordered are listed, but only abnormal results are displayed) Labs Reviewed  GROUP A STREP BY PCR    EKG None  Radiology No results found.  Procedures Procedures (including critical care time)  Medications Ordered in ED Medications  acetaminophen (TYLENOL) tablet 1,000 mg (1,000 mg Oral Given 03/02/20 0136)  dexamethasone (DECADRON) 10 MG/ML injection for Pediatric ORAL use 10 mg (10 mg Oral Given 03/02/20 0136)    ED Course  I have reviewed the triage vital signs and the nursing notes.  Pertinent labs & imaging results that were available during my care of the patient were reviewed by me and considered in my medical decision making (see chart for details).    MDM Rules/Calculators/A&P                          Patient presents with sore throat, fever and body aches. Discussed differential diagnosis including viral syndrome, strep pharyngitis, other. No signs of abscess. Patient is tachycardic, mild dehydration. Plan for pain meds/antipyretics and oral fluids. Strep test pending. Steroids given to help with swelling. Strep test negative. Heart rate improved in the ER with supportive care. Blood pressure mild elevated and patient will need close outpatient follow-up for this.   Final Clinical Impression(s) / ED Diagnoses Final diagnoses:  Acute pharyngitis, unspecified etiology  Elevated blood pressure reading    Rx / DC Orders ED Discharge Orders    None       Blane Ohara, MD 03/02/20 [phone]

## 2020-03-02 NOTE — Discharge Instructions (Addendum)
Stay well-hydrated and use Tylenol and ibuprofen as needed for pain and fevers. Return for new or worsening symptoms. Have blood pressure rechecked next week.

## 2020-03-26 ENCOUNTER — Encounter: Payer: Self-pay | Admitting: Physician Assistant

## 2022-03-26 IMAGING — CT CT ANGIO CHEST
2 of 6 series · 17 of 46 positions shown · IV contrast (omnipaque)
Comparison: Chest radiograph dated 01/30/2019

CLINICAL DATA: Chest pain, shortness of breath, positive D-dimer

EXAM:
CT ANGIOGRAPHY CHEST WITH CONTRAST
TECHNIQUE: Multidetector CT imaging of the chest was performed using the
standard protocol during bolus administration of intravenous
contrast. Multiplanar CT image reconstructions and MIPs were
obtained to evaluate the vascular anatomy.
CONTRAST:  80mL OMNIPAQUE IOHEXOL 350 MG/ML SOLN

[Series 7: thins · axial · 0.88mm/px · z∈[+1390,+1626]mm · 14 of 259 slices shown]
[im 12/259  lung]
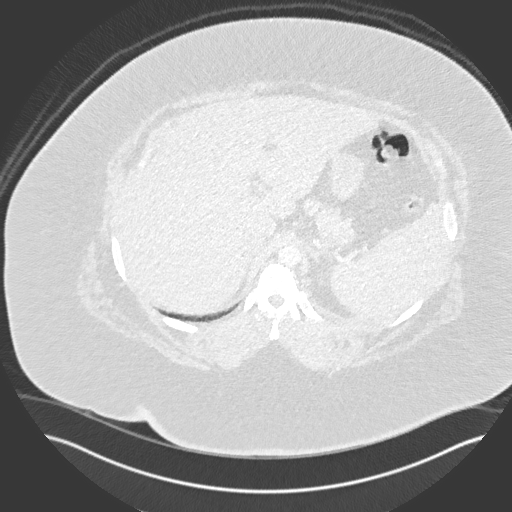
[im 34/259  soft-tissue]
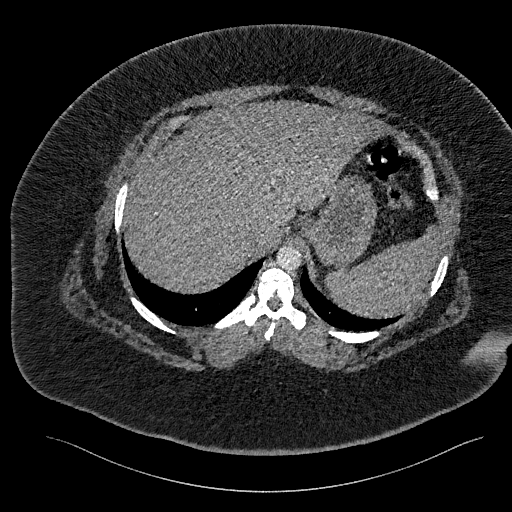
[im 45/259  lung]
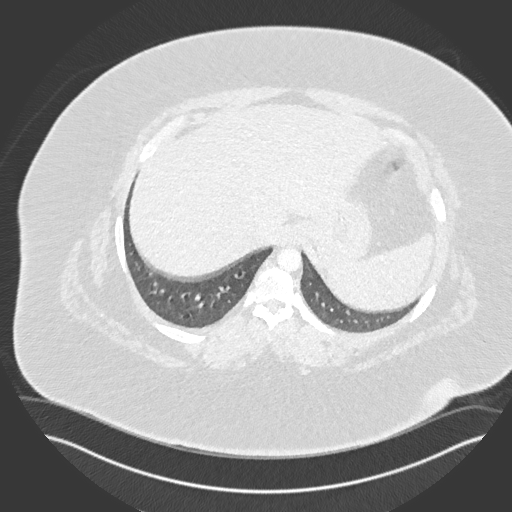
[im 68/259  soft-tissue]
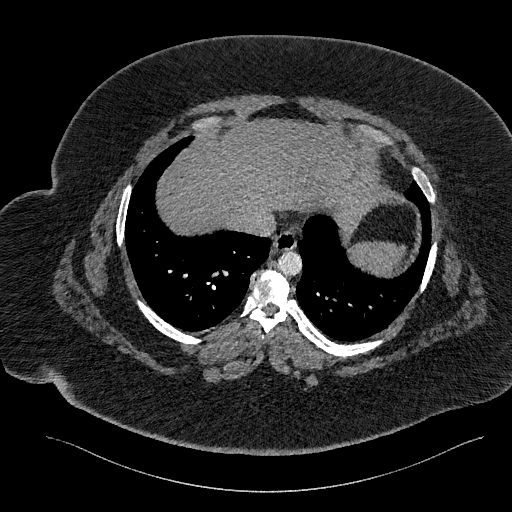
[im 90/259  lung]
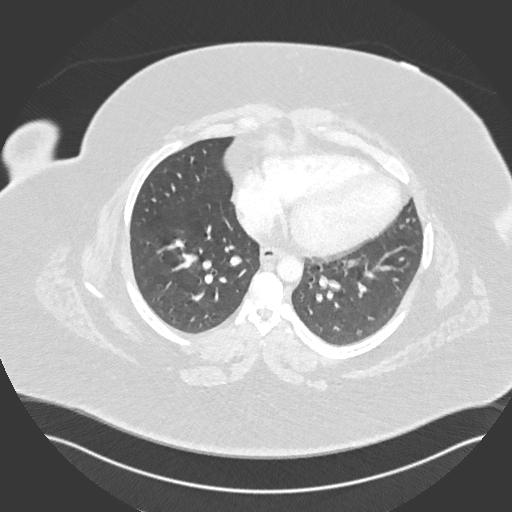
[im 101/259  soft-tissue]
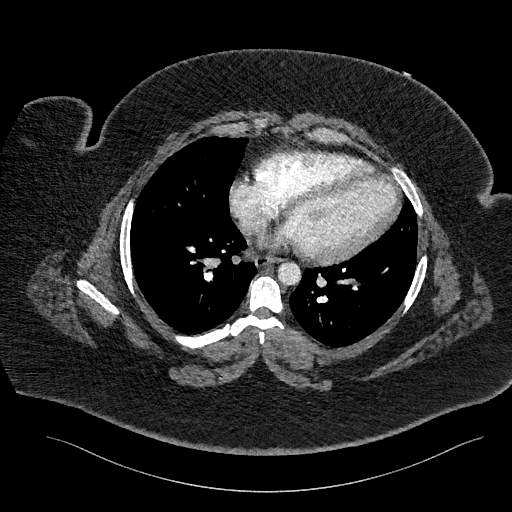
[im 124/259  lung]
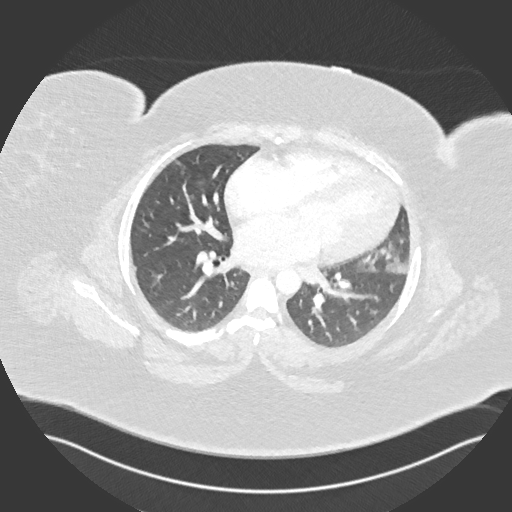
[im 135/259  soft-tissue]
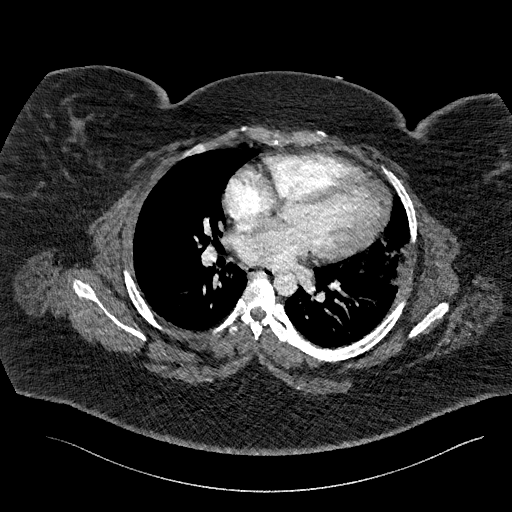
[im 158/259  lung]
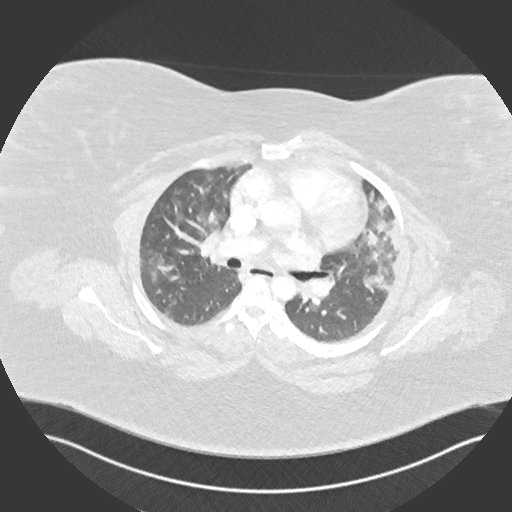
[im 169/259  soft-tissue]
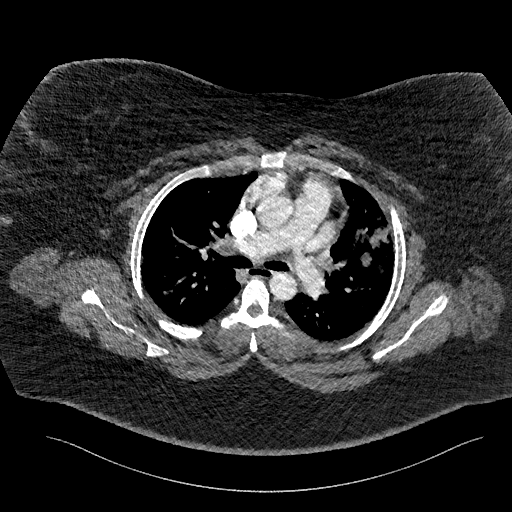
[im 191/259  lung]
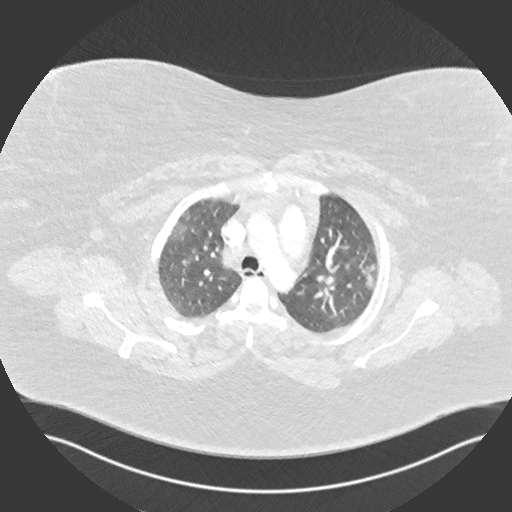
[im 214/259  soft-tissue]
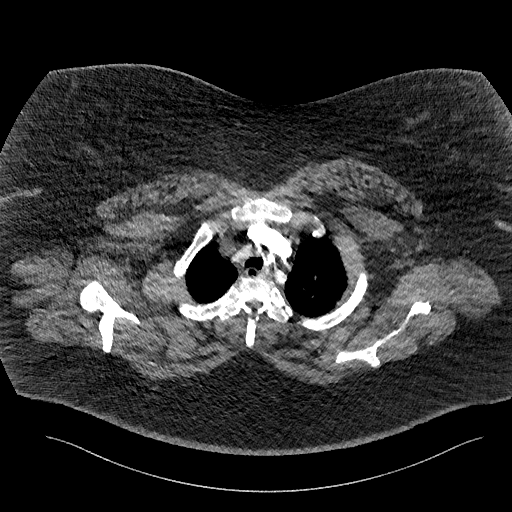
[im 225/259  lung]
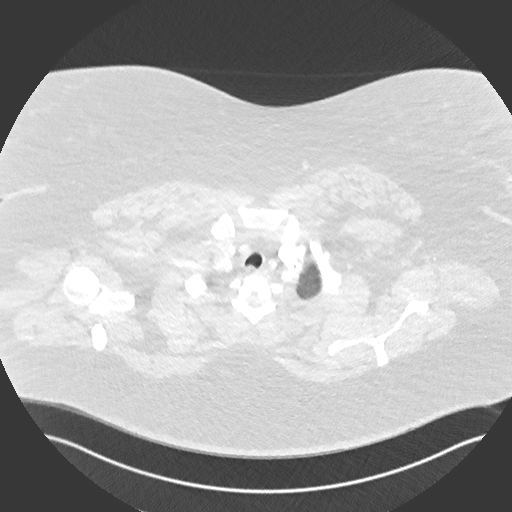
[im 247/259  soft-tissue]
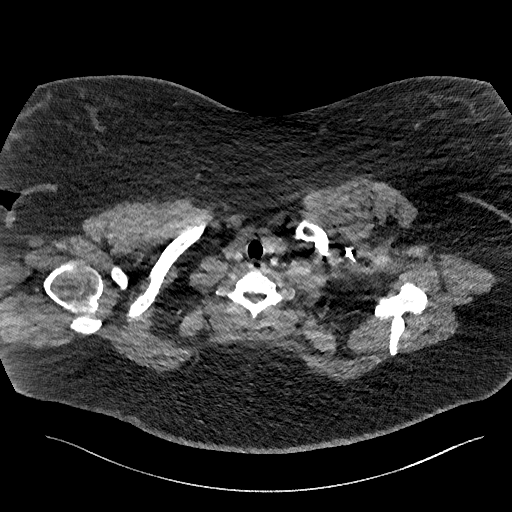

[Series 9: coronal mpr · coronal · 0.51mm/px · 3 of 187 slices shown]
[im 47/187  soft-tissue]
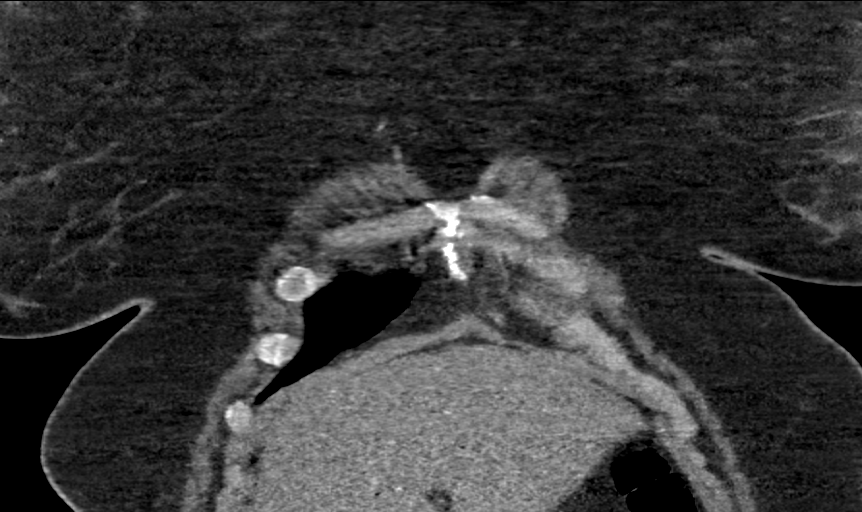
[im 94/187  soft-tissue]
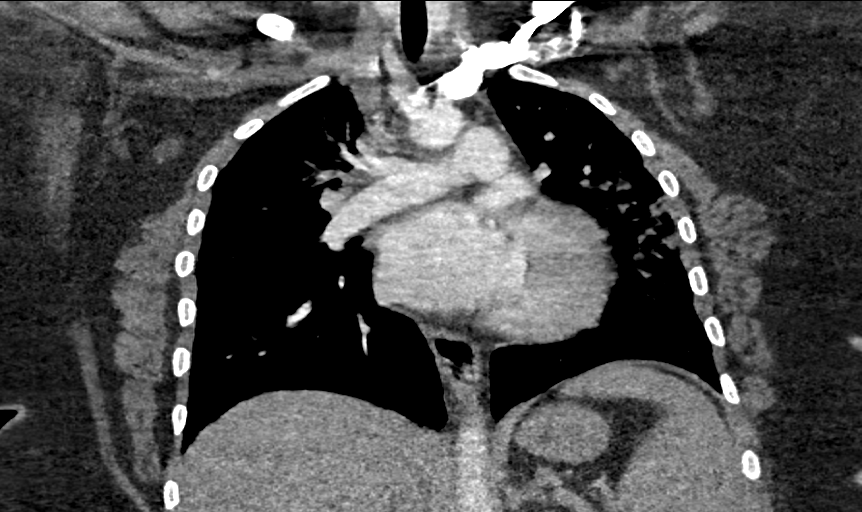
[im 140/187  soft-tissue]
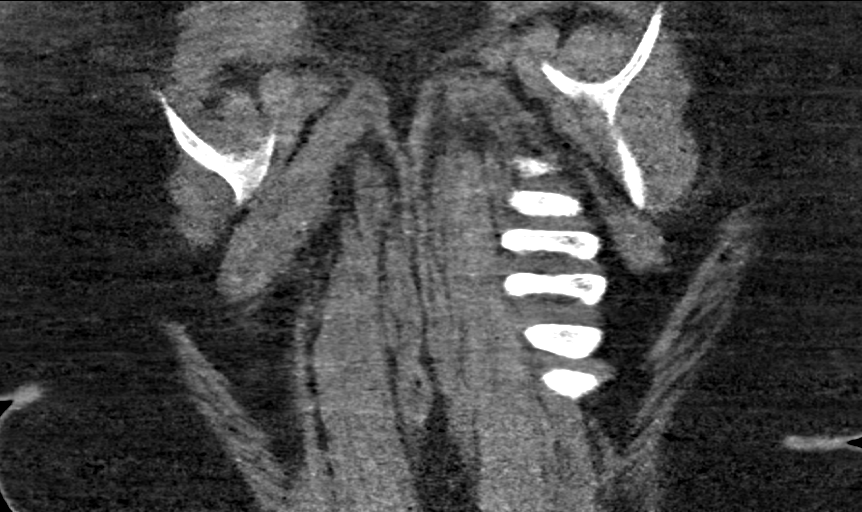

[17 of 46 positions shown; findings below may reference images not displayed]

FINDINGS: Cardiovascular: Evaluation of the bilateral lower lobe pulmonary
arteries to the lobar level. Upper lobe pulmonary arteries are only
evaluated centrally. Within that constraint, there is no evidence of
pulmonary embolism.

No evidence thoracic aortic aneurysm or dissection.

Heart is normal in size.  No pericardial effusion.

Mediastinum/Nodes: No suspicious mediastinal lymphadenopathy.

Visualized thyroid is unremarkable.

Lungs/Pleura: Multifocal patchy/ground-glass opacities in the
bilateral upper lobes, left greater than right, suspicious for
multifocal pneumonia.

No suspicious pulmonary nodules.

No pleural effusion or pneumothorax.

Upper Abdomen: Visualized upper abdomen is grossly unremarkable.

Musculoskeletal: Mild degenerative changes of the mid thoracic
spine.

Review of the MIP images confirms the above findings.
IMPRESSION: No evidence of pulmonary embolism.

Multifocal pneumonia in the bilateral upper lobes, left greater than
right.

## 2024-02-05 ENCOUNTER — Other Ambulatory Visit: Payer: Self-pay

## 2024-02-05 ENCOUNTER — Encounter (HOSPITAL_COMMUNITY): Payer: Self-pay

## 2024-02-05 ENCOUNTER — Emergency Department (HOSPITAL_COMMUNITY)
Admission: EM | Admit: 2024-02-05 | Discharge: 2024-02-05 | Disposition: A | Discharge status: Home / Self Care | Attending: Emergency Medicine | Admitting: Emergency Medicine

## 2024-02-05 DIAGNOSIS — M7918 Myalgia, other site: Secondary | ICD-10-CM

## 2024-02-05 DIAGNOSIS — Y92410 Unspecified street and highway as the place of occurrence of the external cause: Secondary | ICD-10-CM | POA: Insufficient documentation

## 2024-02-05 DIAGNOSIS — M25511 Pain in right shoulder: Secondary | ICD-10-CM | POA: Diagnosis not present

## 2024-02-05 DIAGNOSIS — M542 Cervicalgia: Secondary | ICD-10-CM | POA: Diagnosis present

## 2024-02-05 LAB — POC URINE PREG, ED: Preg Test, Ur: NEGATIVE

## 2024-02-05 MED ORDER — KETOROLAC TROMETHAMINE 30 MG/ML IJ SOLN
30.0000 mg | Freq: Once | INTRAMUSCULAR | Status: AC
  Administered 2024-02-05: 30 mg via INTRAMUSCULAR
  Filled 2024-02-05: qty 1

## 2024-02-05 MED ORDER — METHOCARBAMOL 500 MG PO TABS
500.0000 mg | ORAL_TABLET | Freq: Once | ORAL | Status: AC
  Administered 2024-02-05: 500 mg via ORAL
  Filled 2024-02-05: qty 1

## 2024-02-05 MED ORDER — METHOCARBAMOL 500 MG PO TABS: 500.0000 mg | ORAL_TABLET | Freq: Two times a day (BID) | ORAL | 0 refills | Status: DC

## 2024-02-05 MED ORDER — KETOROLAC TROMETHAMINE 30 MG/ML IJ SOLN: 30.0000 mg | Freq: Once | INTRAMUSCULAR | Status: DC

## 2024-02-05 MED ORDER — ACETAMINOPHEN 500 MG PO TABS
1000.0000 mg | ORAL_TABLET | Freq: Once | ORAL | Status: AC
  Administered 2024-02-05: 1000 mg via ORAL
  Filled 2024-02-05: qty 2

## 2024-02-05 MED ORDER — METHOCARBAMOL 500 MG PO TABS: 500.0000 mg | ORAL_TABLET | Freq: Two times a day (BID) | ORAL | 0 refills | Status: AC

## 2024-02-05 NOTE — Discharge Instructions (Signed)
 Please take tylenol  500mg  every 4 hours. You make take 1000mg  at a time but DO NOT TAKE more than 4000mg  in a 24hr period. Please take 400mg  Ibuprofen  every 4 hours. Do not take Ibuprofen  until at least 12am due to receiving toradol  in ED. You also were prescribed a muscle relaxer called Robaxin.  You may take 2 times a day for the next 4 days. Please continue all daily activities and stretching. Thank you for allowing us  to take care of you today.  We hope you begin feeling better soon.   To-Do:  Please follow-up with your primary doctor within the next 2-3 days. Please return to the Emergency Department or call 911 if you experience chest pain, shortness of breath, severe pain, severe fever, altered mental status, or have any reason to think that you need emergency medical care.  Thank you again.  Hope you feel better soon.  [facility] Department of Emergency Medicine

## 2024-02-05 NOTE — ED Notes (Signed)
 AVS provided by edp was reviewed with pt. Pt verbalized understanding with no additional questions at this time. Pt ambulatory at time of discharge. Pt going home with roommate/friend.
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
